# Patient Record
Sex: Female | Born: 1955 | Race: White | Hispanic: No | Smoking: Former smoker
Health system: Southern US, Community
[De-identification: ages and names within clinical notes are randomized; demographics above are authoritative.]

## PROBLEM LIST (undated history)

## (undated) DIAGNOSIS — F329 Major depressive disorder, single episode, unspecified: Secondary | ICD-10-CM

## (undated) DIAGNOSIS — E7800 Pure hypercholesterolemia, unspecified: Secondary | ICD-10-CM

## (undated) DIAGNOSIS — E119 Type 2 diabetes mellitus without complications: Secondary | ICD-10-CM

## (undated) DIAGNOSIS — F32A Depression, unspecified: Secondary | ICD-10-CM

## (undated) DIAGNOSIS — C439 Malignant melanoma of skin, unspecified: Secondary | ICD-10-CM

## (undated) DIAGNOSIS — G4733 Obstructive sleep apnea (adult) (pediatric): Secondary | ICD-10-CM

## (undated) DIAGNOSIS — E669 Obesity, unspecified: Secondary | ICD-10-CM

## (undated) DIAGNOSIS — Z9989 Dependence on other enabling machines and devices: Secondary | ICD-10-CM

## (undated) HISTORY — DX: Obstructive sleep apnea (adult) (pediatric): G47.33

## (undated) HISTORY — DX: Dependence on other enabling machines and devices: Z99.89

## (undated) HISTORY — DX: Malignant melanoma of skin, unspecified: C43.9

## (undated) HISTORY — PX: INCISE AND DRAIN ABCESS: PRO64

## (undated) HISTORY — DX: Type 2 diabetes mellitus without complications: E11.9

## (undated) HISTORY — DX: Obesity, unspecified: E66.9

## (undated) HISTORY — PX: OTHER SURGICAL HISTORY: SHX169

---

## 2001-02-28 ENCOUNTER — Encounter: Payer: Self-pay | Admitting: Family Medicine

## 2001-02-28 ENCOUNTER — Ambulatory Visit (HOSPITAL_COMMUNITY): Admission: RE | Admit: 2001-02-28 | Discharge: 2001-02-28 | Payer: Self-pay | Admitting: Family Medicine

## 2001-04-27 ENCOUNTER — Encounter: Payer: Self-pay | Admitting: *Deleted

## 2001-04-27 ENCOUNTER — Emergency Department (HOSPITAL_COMMUNITY): Admission: EM | Admit: 2001-04-27 | Discharge: 2001-04-27 | Payer: Self-pay | Admitting: *Deleted

## 2003-01-29 ENCOUNTER — Ambulatory Visit (HOSPITAL_COMMUNITY): Admission: RE | Admit: 2003-01-29 | Discharge: 2003-01-29 | Payer: Self-pay | Admitting: Internal Medicine

## 2003-01-29 HISTORY — PX: COLONOSCOPY: SHX174

## 2003-08-07 ENCOUNTER — Emergency Department (HOSPITAL_COMMUNITY): Admission: EM | Admit: 2003-08-07 | Discharge: 2003-08-07 | Payer: Self-pay | Admitting: Emergency Medicine

## 2004-07-09 ENCOUNTER — Emergency Department (HOSPITAL_COMMUNITY): Admission: EM | Admit: 2004-07-09 | Discharge: 2004-07-09 | Payer: Self-pay | Admitting: Emergency Medicine

## 2004-07-10 ENCOUNTER — Inpatient Hospital Stay (HOSPITAL_COMMUNITY): Admission: EM | Admit: 2004-07-10 | Discharge: 2004-07-16 | Payer: Self-pay | Admitting: Emergency Medicine

## 2004-07-17 ENCOUNTER — Encounter (HOSPITAL_COMMUNITY): Admission: RE | Admit: 2004-07-17 | Discharge: 2004-08-16 | Payer: Self-pay | Admitting: General Surgery

## 2004-08-25 ENCOUNTER — Ambulatory Visit (HOSPITAL_COMMUNITY): Admission: RE | Admit: 2004-08-25 | Discharge: 2004-08-25 | Payer: Self-pay | Admitting: General Surgery

## 2005-09-09 ENCOUNTER — Inpatient Hospital Stay (HOSPITAL_COMMUNITY): Admission: EM | Admit: 2005-09-09 | Discharge: 2005-09-13 | Payer: Self-pay | Admitting: Emergency Medicine

## 2006-06-06 ENCOUNTER — Emergency Department (HOSPITAL_COMMUNITY): Admission: EM | Admit: 2006-06-06 | Discharge: 2006-06-06 | Payer: Self-pay | Admitting: Emergency Medicine

## 2006-06-08 ENCOUNTER — Emergency Department (HOSPITAL_COMMUNITY): Admission: EM | Admit: 2006-06-08 | Discharge: 2006-06-08 | Payer: Self-pay | Admitting: Emergency Medicine

## 2006-06-28 ENCOUNTER — Ambulatory Visit (HOSPITAL_COMMUNITY): Admission: RE | Admit: 2006-06-28 | Discharge: 2006-06-28 | Payer: Self-pay | Admitting: Family Medicine

## 2008-09-22 ENCOUNTER — Ambulatory Visit (HOSPITAL_COMMUNITY): Admission: RE | Admit: 2008-09-22 | Discharge: 2008-09-22 | Payer: Self-pay | Admitting: Family Medicine

## 2008-12-02 ENCOUNTER — Ambulatory Visit: Payer: Self-pay | Admitting: Internal Medicine

## 2008-12-08 ENCOUNTER — Encounter: Payer: Self-pay | Admitting: Gastroenterology

## 2008-12-31 ENCOUNTER — Encounter: Payer: Self-pay | Admitting: Internal Medicine

## 2008-12-31 ENCOUNTER — Ambulatory Visit (HOSPITAL_COMMUNITY): Admission: RE | Admit: 2008-12-31 | Discharge: 2008-12-31 | Payer: Self-pay | Admitting: Internal Medicine

## 2008-12-31 ENCOUNTER — Ambulatory Visit: Payer: Self-pay | Admitting: Internal Medicine

## 2009-01-04 ENCOUNTER — Encounter: Payer: Self-pay | Admitting: Internal Medicine

## 2009-01-05 ENCOUNTER — Telehealth: Payer: Self-pay | Admitting: Internal Medicine

## 2009-01-17 ENCOUNTER — Telehealth (INDEPENDENT_AMBULATORY_CARE_PROVIDER_SITE_OTHER): Payer: Self-pay

## 2009-01-18 ENCOUNTER — Telehealth (INDEPENDENT_AMBULATORY_CARE_PROVIDER_SITE_OTHER): Payer: Self-pay

## 2009-07-29 ENCOUNTER — Emergency Department (HOSPITAL_COMMUNITY): Admission: EM | Admit: 2009-07-29 | Discharge: 2009-07-29 | Payer: Self-pay | Admitting: Emergency Medicine

## 2009-08-03 ENCOUNTER — Inpatient Hospital Stay (HOSPITAL_COMMUNITY): Admission: RE | Admit: 2009-08-03 | Discharge: 2009-08-12 | Payer: Self-pay | Admitting: Family Medicine

## 2009-10-20 ENCOUNTER — Ambulatory Visit (HOSPITAL_COMMUNITY): Admission: RE | Admit: 2009-10-20 | Discharge: 2009-10-20 | Payer: Self-pay | Admitting: Family Medicine

## 2010-01-20 ENCOUNTER — Encounter (INDEPENDENT_AMBULATORY_CARE_PROVIDER_SITE_OTHER): Payer: Self-pay | Admitting: *Deleted

## 2010-02-07 ENCOUNTER — Ambulatory Visit: Payer: Self-pay | Admitting: Internal Medicine

## 2010-02-07 DIAGNOSIS — K6289 Other specified diseases of anus and rectum: Secondary | ICD-10-CM | POA: Insufficient documentation

## 2010-02-07 DIAGNOSIS — K625 Hemorrhage of anus and rectum: Secondary | ICD-10-CM | POA: Insufficient documentation

## 2010-02-07 DIAGNOSIS — Z86010 Personal history of colonic polyps: Secondary | ICD-10-CM | POA: Insufficient documentation

## 2010-02-17 ENCOUNTER — Encounter: Payer: Self-pay | Admitting: Internal Medicine

## 2010-03-03 ENCOUNTER — Ambulatory Visit (HOSPITAL_COMMUNITY): Admission: RE | Admit: 2010-03-03 | Discharge: 2010-03-03 | Payer: Self-pay | Admitting: Internal Medicine

## 2010-03-03 ENCOUNTER — Ambulatory Visit: Payer: Self-pay | Admitting: Internal Medicine

## 2010-03-03 HISTORY — PX: COLONOSCOPY: SHX174

## 2010-03-10 ENCOUNTER — Encounter: Payer: Self-pay | Admitting: Internal Medicine

## 2010-03-15 ENCOUNTER — Ambulatory Visit (HOSPITAL_COMMUNITY): Admission: RE | Admit: 2010-03-15 | Discharge: 2010-03-15 | Payer: Self-pay | Admitting: General Surgery

## 2010-05-26 ENCOUNTER — Emergency Department (HOSPITAL_COMMUNITY)
Admission: EM | Admit: 2010-05-26 | Discharge: 2010-05-26 | Payer: Self-pay | Source: Home / Self Care | Admitting: Emergency Medicine

## 2010-06-25 HISTORY — PX: ANAL FISSURE REPAIR: SHX2312

## 2010-07-25 NOTE — Letter (Signed)
Summary: Scheduled Appointment  [facility]  [address]   [place], [place] [address]   Phone: [phone]  Fax: [phone]    January 20, 2010   Dear: [name]            DOB: [birth year]    I have been instructed to schedule you an appointment in our office.  Your appointment is as follows:   Date: 02/07/2010   Time: 10:00 AM  Please be here 15 minutes early.   Provider: Tana Coast, PA    Please contact the office if you need to reschedule this appointment for a more convenient time.   Thank you,    Rexene Alberts       [facility] Ph: [phone]   Fax: [phone]

## 2010-07-25 NOTE — Letter (Signed)
Summary: HISTORY & PHYSICAL  HISTORY & PHYSICAL   Imported By: Rexene Alberts 03/10/2010 12:20:07  _____________________________________________________________________  External Attachment:    Type:   Image     Comment:   External Document

## 2010-07-25 NOTE — Letter (Signed)
Summary: FLEX SIG ORDER  FLEX SIG ORDER   Imported By: Rosine Beat 02/17/2010 14:20:02  _____________________________________________________________________  External Attachment:    Type:   Image     Comment:   External Document

## 2010-07-25 NOTE — Assessment & Plan Note (Signed)
Summary: C/O RECTAL BLEEDING/LAW   Visit Type:  Follow-up Visit Primary Care Provider:  [name]  Chief Complaint:  c/o rectal bleeding.  History of Present Illness: [name] is a pleasant 55 y/o WF, who presents for further evaluation of rectal bleeding and mild rectal pain. She has TCS in 7/10 for h/o polyps. 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 She states she started having rectal pain after taking enema last yr for procedure. Symptoms were severe for awhile but eventually went away.   She complains of recurrent rectal pain, but not as severe as last year. Rectal bleeding for awhile, but recently increased bleeding. Soil through pants. Rectal burning. If strain, rectal pain.  After had PNA in 2/11, lots of straining. Brought back on pain. BM 3-4 per day, normal for her. Formed stools. No melena. No abd pain, n/v, heartburn. No weight loss. PP fecal urgency. Knows where all bathrooms are. No nocturnal BMs.   Current Medications (verified): 1)  Celexa 40 Mg Tabs (Citalopram Hydrobromide) 2)  Zocor 80 Mg Tabs (Simvastatin) .... Take 1 Tablet By Mouth Once A Day 3)  Omeprazole 20 Mg Cpdr (Omeprazole) .... Take 1 Tablet By Mouth Once A Day 4)  B 12 .... Take 1 Tablet By Mouth Once A Day 5)  Vitamin D 41937 Iu .... Once Weekly  Allergies (verified): No Known Drug Allergies  Past History:  Past Medical History: Depression Hyperlipidemia Vit D deficiency Colon polyps, see HPI GERD Hospitalized for PNA 07/2009 TCS 7/10-->Distal rectal polyp (tubulovillous adenoma), otherwise unremarkable rectum. Elongated tortuous colon, scattered left-sided diverticula, polyps (tubular adenomas) in the mid-descending and ascending segments. Next TCS 12/2011.  Past Surgical History: Reviewed history from 12/02/2008 and no changes required. MRSA left axillary   Family History: Reviewed history from 12/02/2008 and no changes required. No FH of Colon  Cancer or polyps  Social History: Reviewed history from 12/02/2008 and no changes required. Patient is a former smoker. Quit 15 years ago. Alcohol Use - no Illicit Drug Use - no Married, no children. Housekeeper at [employer]  Review of Systems General:  Denies fever, chills, sweats, anorexia, fatigue, weakness, and weight loss. Eyes:  Denies vision loss. ENT:  Denies nasal congestion, sore throat, hoarseness, and difficulty swallowing. CV:  Denies chest pains, angina, palpitations, dyspnea on exertion, and peripheral edema. Resp:  Denies dyspnea at rest, dyspnea with exercise, cough, sputum, and wheezing. GI:  See HPI. GU:  Denies urinary burning and blood in urine. MS:  Denies joint pain / LOM. Derm:  Denies rash and itching. Neuro:  Denies weakness, frequent headaches, memory loss, and confusion. Psych:  Denies depression and anxiety. Endo:  Denies unusual weight change. Heme:  Denies bruising and bleeding. Allergy:  Denies hives, rash, and sneezing.  Vital Signs:  Patient profile:   55 year old female Height:      68 inches Weight:      311 pounds BMI:     47.46 Temp:     98.9 degrees F oral Pulse rate:   80 / minute BP sitting:   132 / 80  (left arm) Cuff size:   large  Vitals Entered By: Cloria Spring LPN (February 07, 2010 10:05 AM)  Physical Exam  General:  Well developed, well nourished, no acute distress.obese.   Head:  Normocephalic and atraumatic. Eyes:  sclera nonicteric Mouth:  Oropharyngeal mucosa moist, pink.  No lesions, erythema or exudate.    Neck:  Supple; no masses or thyromegaly.  Lungs:  Clear throughout to auscultation. Heart:  Regular rate and rhythm; no murmurs, rubs,  or bruits. Abdomen:  Bowel sounds normal.  Abdomen is soft, nontender, nondistended.  No rebound or guarding.  No hepatosplenomegaly, masses or hernias.  No abdominal bruits. obese.   Rectal:  Deferred per pt request. Exam planned at time of FS. Extremities:  No clubbing, cyanosis,  edema or deformities noted. Neurologic:  Alert and  oriented x4;  grossly normal neurologically. Skin:  Intact without significant lesions or rashes. Cervical Nodes:  No significant cervical adenopathy. Psych:  Alert and cooperative. Normal mood and affect.  Impression & Recommendations:  Problem # 1:  RECTAL BLEEDING (ICD-569.3)  Increased rectal bleeding associated with mild to moderate anorectal pain. Last year there was consideration that patient developed fissure. Symptoms resolved until over the past several months. She had advanced adenoma in distal rectum. Patient likely has fissure or other benign anorectal disease but given her history of advanced adenoma and increased bleeding, offered her flex sigmoidoscopy with sedation. Patient is reluctant to internal creams or suppositories due to discomfort and fear of hurting herself. Will hold off on therapy until after FS.   Flex Sig with sedation to be performed in near future.  Risks, alternatives, and benefits including but not limited to the risk of reaction to medication, bleeding, infection, and perforation were addressed.  Patient voiced understanding and provided verbal consent.   Orders: Est. Patient Level IV (25956)

## 2010-09-07 LAB — CBC
HCT: 39.5 % (ref 36.0–46.0)
MCHC: 33.4 g/dL (ref 30.0–36.0)
Platelets: 266 10*3/uL (ref 150–400)
RBC: 4.46 MIL/uL (ref 3.87–5.11)
RDW: 15.4 % (ref 11.5–15.5)
WBC: 6.4 10*3/uL (ref 4.0–10.5)

## 2010-09-07 LAB — BASIC METABOLIC PANEL
BUN: 14 mg/dL (ref 6–23)
CO2: 27 mEq/L (ref 19–32)
Chloride: 104 mEq/L (ref 96–112)
Creatinine, Ser: 0.85 mg/dL (ref 0.4–1.2)
Potassium: 4.3 mEq/L (ref 3.5–5.1)

## 2010-09-13 LAB — BASIC METABOLIC PANEL
BUN: 10 mg/dL (ref 6–23)
BUN: 28 mg/dL — ABNORMAL HIGH (ref 6–23)
BUN: 6 mg/dL (ref 6–23)
CO2: 27 mEq/L (ref 19–32)
CO2: 27 mEq/L (ref 19–32)
CO2: 27 mEq/L (ref 19–32)
Calcium: 7.9 mg/dL — ABNORMAL LOW (ref 8.4–10.5)
Calcium: 8.1 mg/dL — ABNORMAL LOW (ref 8.4–10.5)
Chloride: 106 mEq/L (ref 96–112)
Chloride: 110 mEq/L (ref 96–112)
Chloride: 98 mEq/L (ref 96–112)
Creatinine, Ser: 0.57 mg/dL (ref 0.4–1.2)
Creatinine, Ser: 0.8 mg/dL (ref 0.4–1.2)
Creatinine, Ser: 0.92 mg/dL (ref 0.4–1.2)
Creatinine, Ser: 1.38 mg/dL — ABNORMAL HIGH (ref 0.4–1.2)
GFR calc Af Amer: >60 mL/min (ref >60)
GFR calc Af Amer: >60 mL/min (ref >60)
GFR calc non Af Amer: >60 mL/min (ref >60)
Glucose, Bld: 108 mg/dL — ABNORMAL HIGH (ref 70–99)
Glucose, Bld: 126 mg/dL — ABNORMAL HIGH (ref 70–99)
Glucose, Bld: 128 mg/dL — ABNORMAL HIGH (ref 70–99)
Potassium: 3.1 mEq/L — ABNORMAL LOW (ref 3.5–5.1)
Potassium: 3.1 mEq/L — ABNORMAL LOW (ref 3.5–5.1)
Sodium: 146 mEq/L — ABNORMAL HIGH (ref 135–145)

## 2010-09-13 LAB — DIFFERENTIAL
Basophils Absolute: 0 10*3/uL (ref 0.0–0.1)
Basophils Relative: 0 % (ref 0–1)
Basophils Relative: 0 % (ref 0–1)
Basophils Relative: 1 % (ref 0–1)
Eosinophils Absolute: 0 10*3/uL (ref 0.0–0.7)
Eosinophils Absolute: 0 10*3/uL (ref 0.0–0.7)
Eosinophils Absolute: 0 10*3/uL (ref 0.0–0.7)
Eosinophils Absolute: 0.1 10*3/uL (ref 0.0–0.7)
Eosinophils Relative: 0 % (ref 0–5)
Eosinophils Relative: 0 % (ref 0–5)
Eosinophils Relative: 1 % (ref 0–5)
Lymphocytes Relative: 7 % — ABNORMAL LOW (ref 12–46)
Lymphs Abs: 0.5 10*3/uL — ABNORMAL LOW (ref 0.7–4.0)
Lymphs Abs: 0.5 10*3/uL — ABNORMAL LOW (ref 0.7–4.0)
Lymphs Abs: 0.6 10*3/uL — ABNORMAL LOW (ref 0.7–4.0)
Lymphs Abs: 0.6 10*3/uL — ABNORMAL LOW (ref 0.7–4.0)
Lymphs Abs: 0.6 10*3/uL — ABNORMAL LOW (ref 0.7–4.0)
Monocytes Relative: 3 % (ref 3–12)
Monocytes Relative: 4 % (ref 3–12)
Monocytes Relative: 6 % (ref 3–12)
Neutro Abs: 6.6 10*3/uL (ref 1.7–7.7)
Neutro Abs: 6.6 10*3/uL (ref 1.7–7.7)
Neutrophils Relative %: 76 % (ref 43–77)
Neutrophils Relative %: 80 % — ABNORMAL HIGH (ref 43–77)
Neutrophils Relative %: 84 % — ABNORMAL HIGH (ref 43–77)
Neutrophils Relative %: 86 % — ABNORMAL HIGH (ref 43–77)

## 2010-09-13 LAB — CBC
HCT: 35.4 % — ABNORMAL LOW (ref 36.0–46.0)
HCT: 37.6 % (ref 36.0–46.0)
HCT: 38.4 % (ref 36.0–46.0)
HCT: 41.2 % (ref 36.0–46.0)
Hemoglobin: 12.6 g/dL (ref 12.0–15.0)
MCHC: 33.5 g/dL (ref 30.0–36.0)
MCV: 86.1 fL (ref 78.0–100.0)
MCV: 86.6 fL (ref 78.0–100.0)
MCV: 86.7 fL (ref 78.0–100.0)
MCV: 86.9 fL (ref 78.0–100.0)
Platelets: 183 10*3/uL (ref 150–400)
Platelets: 268 10*3/uL (ref 150–400)
Platelets: 446 10*3/uL — ABNORMAL HIGH (ref 150–400)
Platelets: 447 10*3/uL — ABNORMAL HIGH (ref 150–400)
RBC: 4.09 MIL/uL (ref 3.87–5.11)
RBC: 4.43 MIL/uL (ref 3.87–5.11)
RBC: 4.78 MIL/uL (ref 3.87–5.11)
RDW: 15 % (ref 11.5–15.5)
RDW: 15.2 % (ref 11.5–15.5)
RDW: 15.6 % — ABNORMAL HIGH (ref 11.5–15.5)
WBC: 4.6 10*3/uL (ref 4.0–10.5)
WBC: 4.8 10*3/uL (ref 4.0–10.5)
WBC: 5.3 10*3/uL (ref 4.0–10.5)
WBC: 7.9 10*3/uL (ref 4.0–10.5)
WBC: 8.2 10*3/uL (ref 4.0–10.5)

## 2010-09-13 LAB — BLOOD GAS, ARTERIAL
Acid-Base Excess: 1.5 mmol/L (ref 0.0–2.0)
Acid-Base Excess: 2.6 mmol/L — ABNORMAL HIGH (ref 0.0–2.0)
Acid-Base Excess: 2.6 mmol/L — ABNORMAL HIGH (ref 0.0–2.0)
Bicarbonate: 26 mEq/L — ABNORMAL HIGH (ref 20.0–24.0)
Bicarbonate: 26.3 mEq/L — ABNORMAL HIGH (ref 20.0–24.0)
Bicarbonate: 26.6 mEq/L — ABNORMAL HIGH (ref 20.0–24.0)
Delivery systems: POSITIVE
Expiratory PAP: 6
FIO2: 1 %
FIO2: 1 %
FIO2: 1 %
FIO2: 100 %
FIO2: 50 %
FIO2: 60 %
FIO2: 80 %
Inspiratory PAP: 14
O2 Content: 100 L/min
O2 Content: 6 L/min
O2 Saturation: 86 %
O2 Saturation: 92.6 %
O2 Saturation: 94.9 %
O2 Saturation: 95 %
O2 Saturation: 95.5 %
Patient temperature: 37
Patient temperature: 37
Patient temperature: 37
TCO2: 23.5 mmol/L (ref 0–100)
pCO2 arterial: 36.8 mmHg (ref 35.0–45.0)
pCO2 arterial: 37.2 mmHg (ref 35.0–45.0)
pCO2 arterial: 37.8 mmHg (ref 35.0–45.0)
pH, Arterial: 7.418 — ABNORMAL HIGH (ref 7.350–7.400)
pH, Arterial: 7.436 — ABNORMAL HIGH (ref 7.350–7.400)
pH, Arterial: 7.437 — ABNORMAL HIGH (ref 7.350–7.400)
pO2, Arterial: 51.9 mmHg — ABNORMAL LOW (ref 80.0–100.0)
pO2, Arterial: 66.1 mmHg — ABNORMAL LOW (ref 80.0–100.0)
pO2, Arterial: 69 mmHg — ABNORMAL LOW (ref 80.0–100.0)
pO2, Arterial: 72.6 mmHg — ABNORMAL LOW (ref 80.0–100.0)
pO2, Arterial: 76.7 mmHg — ABNORMAL LOW (ref 80.0–100.0)
pO2, Arterial: 77.9 mmHg — ABNORMAL LOW (ref 80.0–100.0)
pO2, Arterial: 78.1 mmHg — ABNORMAL LOW (ref 80.0–100.0)

## 2010-09-13 LAB — COMPREHENSIVE METABOLIC PANEL
ALT: 30 U/L (ref 0–35)
ALT: 32 U/L (ref 0–35)
AST: 48 U/L — ABNORMAL HIGH (ref 0–37)
AST: 70 U/L — ABNORMAL HIGH (ref 0–37)
Alkaline Phosphatase: 55 U/L (ref 39–117)
CO2: 27 mEq/L (ref 19–32)
Calcium: 7.8 mg/dL — ABNORMAL LOW (ref 8.4–10.5)
Calcium: 8.3 mg/dL — ABNORMAL LOW (ref 8.4–10.5)
GFR calc Af Amer: 28 mL/min — ABNORMAL LOW (ref >60)
GFR calc Af Amer: >60 mL/min (ref >60)
GFR calc non Af Amer: 23 mL/min — ABNORMAL LOW (ref >60)
Potassium: 3.4 mEq/L — ABNORMAL LOW (ref 3.5–5.1)
Sodium: 132 mEq/L — ABNORMAL LOW (ref 135–145)
Sodium: 142 mEq/L (ref 135–145)
Total Protein: 5.4 g/dL — ABNORMAL LOW (ref 6.0–8.3)

## 2010-09-13 LAB — CARDIAC PANEL(CRET KIN+CKTOT+MB+TROPI)
Total CK: 1225 U/L — ABNORMAL HIGH (ref 7–177)
Total CK: 1394 U/L — ABNORMAL HIGH (ref 7–177)
Troponin I: 0.04 ng/mL (ref 0.00–0.06)

## 2010-09-13 LAB — CULTURE, BLOOD (ROUTINE X 2): Culture: NO GROWTH

## 2010-09-13 LAB — VANCOMYCIN, TROUGH: Vancomycin Tr: 13.9 ug/mL (ref 10.0–20.0)

## 2010-11-07 NOTE — Op Note (Signed)
[name], [name]              ACCOUNT NO.:  [id number]   MEDICAL RECORD NO.:  [id number]          PATIENT TYPE:  AMB   LOCATION:  DAY                           FACILITY:  [facility]   PHYSICIAN:  R. Roetta Sessions, M.D. DATE OF BIRTH:  06-06-1956   DATE OF PROCEDURE:  12/31/2008  DATE OF DISCHARGE:                               OPERATIVE REPORT   PROCEDURE:  Colonoscopy and snare polypectomy biopsy.   INDICATIONS FOR PROCEDURE:  The patient is a 55 year old lady with a  history of colonic adenomas removed back in 2001 with colonoscopy lastly  being done in 2004.  Hyperplastic polyps only were removed.  She has no  lower GI tract symptoms.  Colonoscopy is now being done as surveillance  maneuver.  The risks, benefits, alternatives, and limitations have been  reviewed, questions answered.  Please see the documentation in the  medical record.   PROCEDURE NOTE:  O2 saturation, blood pressure, pulse, and respirations  were monitored the entire procedure.  Conscious sedation with Versed 5  mg IV and Demerol mg IV in divided doses.   INSTRUMENT:  Pentax video chip system.   FINDINGS:  Digital rectal exam revealed no abnormalities.   Endoscopic findings:  Prep was good.   Colon:  The colonic mucosa was surveyed from the rectosigmoid junction  through the left, transverse, and right colon to appendiceal orifice,  ileocecal valve, and cecum.  These structures were well-seen and  photographed for the record.  From this level, the scope was slowly and  cautiously withdrawn.  All previously-mentioned mucosal surfaces were  again seen.  The patient had a flat 7-mm polyp in the mid-ascending  colon which was removed with hot snare technique and recovered through  the scope.  There were multiple diminutive polyps in the more distal  ascending segment which were cold biopsied/removed.  In the mid  descending colon, there was a second 7-mm polyp which was pedunculated.  It was removed with hot  snare technique and recovered through the scope.  The patient also had scattered left-sided diverticula.  The patient's  colon was somewhat elongated and tortuous but otherwise appeared  unremarkable.  The scope was pulled down into the rectum where a  thorough examination of the rectal mucosa including retroflexed view of  the anal verge demonstrated a 9-mm pedunculated polyp at 3 cm from the  anal verge.  This was removed with hot snare cautery technique and  recovered through the scope.  The patient tolerated the procedure well  and was reacted in endoscopy.  Cecal withdrawal time 18 minutes.   IMPRESSION:  1. Distal rectal polyp status post hot snare removal as described      above, otherwise unremarkable rectum.  2. Elongated tortuous colon, scattered left-sided diverticula, polyps      in the mid-descending and ascending segments removed as described      above.   RECOMMENDATIONS:  1. No aspirin or __________ for 5 days.  2. Follow-up on pathology.  3. Further recommendations to follow.      Jonathon Bellows, M.D.  Electronically Signed  RMR/MEDQ  D:  12/31/2008  T:  12/31/2008  Job:  [id number]   cc:   [name]. [name], M.D.  Fax: [phone]

## 2010-11-10 NOTE — Group Therapy Note (Signed)
[name], King              ACCOUNT NO.:  [id number]   MEDICAL RECORD NO.:  [id number]          PATIENT TYPE:  INP   LOCATION:  A411                          FACILITY:  [facility]   PHYSICIAN:  [name]. [name], M.D.DATE OF BIRTH:  [birth year]   DATE OF PROCEDURE:  DATE OF DISCHARGE:                                   PROGRESS NOTE   SUBJECTIVE:  She said that the pain has really cleared up in the chest wall.  This was done once her abscess was opened.  She really is feeling better.   OBJECTIVE:  Temperature 97.2, pulse 87, respiratory rate 18, blood pressure  136/74.  She is ambulating in the hall.  Discomforts of the area of  cellulitis is much less red than it was even yesterday.  The wound cultures  are still pending.  Wound did show gram positive cocci in pairs on the gram  stain.   ASSESSMENT:  1.  Cellulitis of the chest wall.  2.  Probable MRSA.  3.  Morbid obesity.   PLAN:  Continue vancomycin.  Discharge home tomorrow.  She needs her packing  changed twice a day.  Discussed with Dr. Malvin Johns.  Will discharge her home  with Zyvox 600 mg b.i.d. (#10, no refills).  While we are waiting for the  final culture results.  If she does have MRSA we will treat her for 30 days.      SDK/MEDQ  D:  07/15/2004  T:  07/15/2004  Job:  [id number]

## 2010-11-10 NOTE — Consult Note (Signed)
NAMEMARIGNY, BORRE NO.:  [id number]   MEDICAL RECORD NO.:  [id number]          PATIENT TYPE:  INP   LOCATION:  A225                          FACILITY:  [facility]   PHYSICIAN:  Barbaraann Barthel, M.D. DATE OF BIRTH:  [birth year]   DATE OF CONSULTATION:  DATE OF DISCHARGE:                                   CONSULTATION   SURGICAL CONSULTATION   Surgery was asked to see this 55 year old white female for an infection of  her left side.  In essence, she had a little folliculitis on her left breast  which she states that she did not squeeze, however, this became more  inflamed.  On Saturday, she went to the emergency room.  She was treated  with  Keflex and returned to the emergency room on followup today two days  later after 24-48 hours worth of treatment, and cellulitis was noted.  Fine  needle aspiration was attempted by the emergency room physician.  No pussy  loculation was encountered.  The patient was admitted to the medical  service, and surgery was asked to consult and evaluate.  In essence, this  patient has an area over the lateral aspect of her left breast.  There is no  fluctuation or crepitance appreciated.  There is an area where it appears  that she has had a fine needle aspiration, but there are no loculations  appreciated.  She has been admitted to the medical service.  She has been  place on parenteral antibiotics.   Her temperature is 98.4, the blood pressure 129/77, heart rate 82, and  respirations 18.  She is a nondiabetic.  Apart from her depression and  hypercholesterolemia and her morbid obesity, she has had no previous surgery  and has no ongoing medical problems otherwise.   IMPRESSION:  Mastitis (cellulitis of left breast).  No abscess appreciated  clinically.   PLAN:  Agree with present parenteral antibiotics.  The patient has been  placed on vancomycin by the medical service.  She has received Levaquin as  well as Keflex as an outpatient  without resolution.  I have also added  moist, warm compresses to her left breast continuously, and I will follow  closely.  This does not appear to be anything suggestive of necrotizing  fasciitis.  The patient does not appear toxic or clinically of a more  worrisome deep soft tissue infection.  Will follow with you.  Would like to  thank Dr. [name] for the consultation.  The rest of the physical  examination is basically as per history and physical of Dr. Michelle Nasuti.  See. Dr. Michelle Nasuti notes.      WB/MEDQ  D:  07/10/2004  T:  07/10/2004  Job:  [id number]

## 2010-11-10 NOTE — Group Therapy Note (Signed)
[name], [name]              ACCOUNT NO.:  [id number]   MEDICAL RECORD NO.:  [id number]          PATIENT TYPE:  INP   LOCATION:  A411                          FACILITY:  [facility]   PHYSICIAN:  [name], M.D.DATE OF BIRTH:  1956-04-14   DATE OF PROCEDURE:  DATE OF DISCHARGE:                                   PROGRESS NOTE   SUBJECTIVE:  She feels a lot better today.  The soreness is clear from her  chest, but she notes it is still red.   OBJECTIVE:  Temperature 98.7, pulse 81, respiratory rate 20, blood pressure  115/63.  She is up brushing her teeth when I first see her.  Is moving  around the room well.  She still has an area of intense erythema involving  the left chest wall, extending to the left lateral breast.   Today the white cell count is 9900 with 72 neutrophils, 16 lymphs; H&H is  11.9 and 37.3.  Met-7 shows a glucose of 127.   ASSESSMENT:  1.  Cellulitis of the chest wall probably from methicillin-resistant      Staphylococcus aureus.  2.  Hyperglycemia.  3.  Obesity.   PLAN:  Continue the vancomycin IV, the warm compresses.  Follow up with Dr.  Malvin Johns in case this abscesses.      SDK/MEDQ  D:  07/12/2004  T:  07/12/2004  Job:  [id number]

## 2010-11-10 NOTE — Group Therapy Note (Signed)
[name], [name]              ACCOUNT NO.:  [id number]   MEDICAL RECORD NO.:  [id number]          PATIENT TYPE:  INP   LOCATION:  A411                          FACILITY:  [facility]   PHYSICIAN:  Angus G. Renard Matter, MD   DATE OF BIRTH:  1955-09-10   DATE OF PROCEDURE:  DATE OF DISCHARGE:                                   PROGRESS NOTE   This patient was admitted with cellulitis of the chest wall due to  methicillin-resistant Staphylococcus aureus. She does have hyperglycemia and  morbid obesity.  Remains on vancomycin.  She has had I&D of chest wall.   OBJECTIVE:  Vital signs: blood pressure 125/72, respirations 18, pulse 97,  temperature 97.1.   LUNGS:  Clear.  HEART:  Regular rhythm.  ABDOMEN:  Negative.  The patient has a draining abscess left chest wall.   ASSESSMENT:  The patient was admitted with an abscess and cellulitis left  chest wall, methicillin-resistant Staphylococcus.  This has been drained.  The patient's condition has remained stable.     Angu   AGM/MEDQ  D:  07/16/2004  T:  07/16/2004  Job:  [id number]

## 2010-11-10 NOTE — Group Therapy Note (Signed)
NAMECHARISMA, CHARLOT              ACCOUNT NO.:  [id number]   MEDICAL RECORD NO.:  [id number]          PATIENT TYPE:  INP   LOCATION:  A411                          FACILITY:  [facility]   PHYSICIAN:  [name]. [name], M.D.DATE OF BIRTH:  [birth year]   DATE OF PROCEDURE:  07/13/2004  DATE OF DISCHARGE:                                   PROGRESS NOTE   SUBJECTIVE:  She is still having pain in the left chest wall, about the same  as yesterday. She is getting up and walking a lot in the halls, staying up  in the chair.   OBJECTIVE:  VITAL SIGNS:  Temperature 97.8, pulse 85, respiratory rate 22,  blood pressure 140/87.  GENERAL:  She is supine in bed currently. Mom is visiting in the room. She  is no acute distress, well-developed, obese, oriented, and alert.  LUNGS:  Clear throughout.  HEART:  Regular rhythm, rate of about 80.  CHEST:  She still has an area of erythema extending in the left chest wall  just inferior to the axilla and extending over the left lateral breast. The  erythema is less than yesterday, but centrally, there is a bluish-violaceous  discoloration and some bogginess.   ASSESSMENT:  1.  Cellulitis of the chest wall, probably secondary to methicillin-      resistant staphylococcus aureus.  2.  Question abscess formation.  3.  Hyperglycemia.  4.  Morbid obesity.   PLAN:  Will discuss with Dr. Malvin Johns about possible I&D. Meanwhile, we will  continue with the vancomycin IV.      SDK/MEDQ  D:  07/13/2004  T:  07/13/2004  Job:  [id number]

## 2010-11-10 NOTE — Group Therapy Note (Signed)
[name], [name]              ACCOUNT NO.:  [id number]   MEDICAL RECORD NO.:  [id number]          PATIENT TYPE:  INP   LOCATION:  A323                          FACILITY:  [facility]   PHYSICIAN:  [name]. [name], M.D.DATE OF BIRTH:  06-22-1956   DATE OF PROCEDURE:  09/12/2005  DATE OF DISCHARGE:                                   PROGRESS NOTE   SUBJECTIVE:  She slept much better last night and, in fact, felt groggy this  morning on Ambien 10 mg nightly.  She had dreams.  She feels she is  breathing better.  She reminds me she got sick about 3 weeks ago with  minimal upper respiratory symptoms, at most a little bit of clear  rhinorrhea.  She has never had asthma before or bronchitis before like this.  She does not smoke.   OBJECTIVE:  VITAL SIGNS:  Temperature 97.7, pulse 70, respirations 20, blood  pressure 135/72.  O2 saturations on room air is 94%.  GENERAL:  She is in no acute distress, well-developed and morbidly obese.  Oriented and alert.  She is supine in bed.  LUNGS:  She sits up and the lungs are clear throughout moving air well,  although decreased breath sounds due to obesity.  There are no intercostal  retractions and there are no use of accessory muscles or respirations.  HEART:  Regular rate and rhythm with rate of about 70.  Heart sounds  somewhat distant.  No edema of the ankles.   ASSESSMENT:  1.  Probable bronchitis.  2.  Morbid obesity.   PLAN:  We are checking a stat CBC, D-dimer and also CT of the lung with  pulmonary emboli protocol to make sure she is not having pulmonary emboli.  I am decreasing her prednisone from 10 mg two b.i.d. to 10 mg one b.i.d. and  discontinuing her Ambien switching it to Temazepam 30 mg nightly.  Hopefully, she will be able to be discharged tomorrow if there are no  pulmonary emboli.      [name]. [name], M.D.  Electronically Signed     SDK/MEDQ  D:  09/12/2005  T:  09/13/2005  Job:  [id number]

## 2010-11-10 NOTE — Group Therapy Note (Signed)
[name], King              ACCOUNT NO.:  [id number]   MEDICAL RECORD NO.:  [id number]          PATIENT TYPE:  INP   LOCATION:  A323                          FACILITY:  [facility]   PHYSICIAN:  [name]. [name], M.D.DATE OF BIRTH:  1955-12-14   DATE OF PROCEDURE:  DATE OF DISCHARGE:                                   PROGRESS NOTE   SUBJECTIVE:  She had a lot of trouble sleeping last night despite using  Ambien 5 mg.  She also was jittery.  Still signs she is having some  wheezing.   OBJECTIVE:  VITAL SIGNS:  Temperature 97.7, pulse 81, respirations 16, blood  pressure 120/71.  LUNGS:  Decreased breath sounds throughout.  There were some expiratory  wheezes in the posterior lungs.  HEART:  Regular rhythm rate of about 80.  ABDOMEN:  Soft and obese.  There is no edema in the ankles.  Color is good  on room air, and O2 saturations 94% and 97% room air today.   ASSESSMENT:  1.  Bronchitis, probably allergic.  2.  Possible bacterial bronchitis.  3.  Morbid obesity.  4.  Insomnia, probably related to steroids.  5.  High dose steroid use.   PLAN:  Discontinue Solu-Medrol and switch to prednisone 20 mg b.i.d.  Discontinue Ambien 5 mg, switch to Ambien 10 mg q.h.s. p.r.n. sleep.  Reevaluate tomorrow and discharge if she is doing fine and p.o. steroids.      [name]. [name], M.D.  Electronically Signed     SDK/MEDQ  D:  09/11/2005  T:  09/12/2005  Job:  [id number]

## 2010-11-10 NOTE — H&P (Signed)
[name], [name]              ACCOUNT NO.:  [id number]   MEDICAL RECORD NO.:  [id number]          PATIENT TYPE:  INP   LOCATION:  A323                          FACILITY:  [facility]   PHYSICIAN:  Catalina Pizza, M.D.        DATE OF BIRTH:  1956-04-15   DATE OF ADMISSION:  09/09/2005  DATE OF DISCHARGE:  LH                                HISTORY & PHYSICAL   PRIMARY CARE PHYSICIAN:  [name]. [name], M.D.   HISTORY OF PRESENT ILLNESS:  [name] is a 55 year old white female who  apparently has had three weeks of upper respiratory symptoms including dry  cough and some amount of wheezing. Apparently, he has been taking multiple  medicines over the counter which has not helped whatsoever. 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 She denies any fever or chills. Did note that she  was having continued dry hacking cough. Apparently, she also had a  gastroenteritis-type bug approximately three weeks ago and saw Dr. [name]  for some type of injection which cured it.   MEDICATIONS:  She is only on lovastatin 40 mg p.o. daily and citalopram 40  mg p.o. daily.   PAST MEDICAL HISTORY:  Only significant for depression, hypercholesterolemia  and morbid obesity. Apparently, she also had a MRSA cellulitis in thoracic  wall in January of 2006 requiring surgical consultation.   SOCIAL HISTORY:  Lives at home with her husband. Has previous history of  smoking but does not smoke currently. No alcohol or other medical issues.   REVIEW OF SYSTEMS:  Only significant for shortness of breath and wheezing  causing her to have worsening nonproductive cough. She also states that she  has had significant amount of thirst over the last month to two months but  no polyrrhea. She also states that she has had a significant amount of  sweating profusely over the last  several months.   PHYSICAL EXAMINATION:  VITAL SIGNS:  Temperature is 98.1, blood pressure  138/91, pulse 99, respirations 20; 0/10 pain at this time; saturating 96% on  room air.  GENERAL:  This is a pleasant morbidly obese white female who appears to have  some mild anxiety at this time.  HEENT:  Pupils are equal, round, and reactive to light and accommodation. No  scleral icterus. Mucous membranes are a bit dry but no other lesions  appreciated. No JVD. No thyromegaly.  LUNGS:  Show good air movement throughout. No significant consolidation that  I can appreciate, but she is morbidly obese. She does not use any accessory  muscles at this time. Does have end-expiratory wheezing but much of this is  upper airway transmitted below.  HEART:  Regular rhythm with no murmur.  ABDOMEN:  Soft, nontender, protuberant with positive bowel sounds.  EXTREMITIES:  No lower extremity edema.  NEUROLOGICAL:  Intact. No deficits appreciated. The patient does have some  mild anxiety.   LABORATORY DATA:  Lab work  obtained shows CBC:  White count of 6.0,  hemoglobin 13.2, platelet count of 310.   Images obtained shows a patchy left lower lobe infiltrates but possibly  difficult secondary to morbid obesity.   IMPRESSION:  This is a 55 year old white female with possible left lower  lobe pneumonia and superimposed bronchitis with morbid obesity.   ASSESSMENT AND PLAN:  1.  The patient will be admitted to regular bed and continued on Levaquin IV      and started on Solu-Medrol to help with inflammation and wheezing as      well as continue with albuterol and Atrovent. After treatment in the      emergency department, she felt like her breathing was improved. We will      also continue with Robitussin DM for cough suppressant.  2.  Excessive thirst and heat intolerance. Will check a CMET and TSH as well      as UA looking for any signs of high blood sugars or problems with      thyroid.    DISPOSITION:  The patient will be seen and evaluated by Dr. [name]  tomorrow. I have discussed with her husband that likely will only need to be  in the hospital for a day, maybe two, for IV antibiotics, but then will be  able to continue as an outpatient on oral medicines, and this will be  followed up on and evaluated by Dr. [name].      Catalina Pizza, M.D.  Electronically Signed     ZH/MEDQ  D:  09/09/2005  T:  09/10/2005  Job:  [id number]

## 2010-11-10 NOTE — Group Therapy Note (Signed)
[name], [name]              ACCOUNT NO.:  [id number]   MEDICAL RECORD NO.:  [id number]          PATIENT TYPE:  INP   LOCATION:  A411                          FACILITY:  [facility]   PHYSICIAN:  [name]. [name], M.D.DATE OF BIRTH:  October 07, 1955   DATE OF PROCEDURE:  07/14/2004  DATE OF DISCHARGE:                                   PROGRESS NOTE   SUBJECTIVE:  She feels a good bit better.  Dr. Malvin Johns did I&D of her left  chest wall yesterday.   OBJECTIVE:  GENERAL:  She is sitting up in bed, feet on the floor, eating  breakfast.  Color is good.  She is alert and oriented in no acute distress.  VITAL SIGNS:  Temperature 97.6, pulse 83, respirations 20, blood pressure  120/69.  LUNGS:  Clear.  HEART:  A regular rhythm with rate of 70.  CHEST:  The wound shows less erythema over her area of cellulitis than there  was yesterday.   Today, white cell count is 10,400.  Glucose 128.  She had both anaerobic and  aerobic cultures done yesterday at the time of surgery and both these show a  few pairs of gram positive cocci.   ASSESSMENT:  1.  Cellulitis of the chest wall, probably methicillin-resistant      Staphylococcus aureus.  2.  Abscess of same, status post incision and drainage.  3.  Hyperglycemia.  4.  Morbid obesity.   PLAN:  Continue with the vancomycin and the drains.  If she is doing better  tomorrow possibly send her home on Zyvox.      SDK/MEDQ  D:  07/14/2004  T:  07/14/2004  Job:  [id number]

## 2010-11-10 NOTE — Group Therapy Note (Signed)
NAMEAXELLE, SZWED              ACCOUNT NO.:  [id number]   MEDICAL RECORD NO.:  [id number]          PATIENT TYPE:  INP   LOCATION:  A323                          FACILITY:  [facility]   PHYSICIAN:  [name]. [name], M.D.DATE OF BIRTH:  [birth year]   DATE OF PROCEDURE:  09/10/2005  DATE OF DISCHARGE:                                   PROGRESS NOTE   SUBJECTIVE:  The patient was admitted yesterday with what appeared to be  allergic bronchitis.  She says her wheezing is better if she is sitting or  lying, but when she starts to stir around she starts wheezing again and gets  very short of breath.   OBJECTIVE:  VITAL SIGNS:  Temperature 97.6, pulse 72, respirations 16, blood  pressure 138/75.  GENERAL:  The color is good.  LUNGS:  Clear throughout.  She is moving air well.  There are no intercostal  retractions, use of accessory muscles or respiration and no  obvious  wheezing or rhonchi heard at rest.  HEART:  Regular rhythm with no murmurs and rate of 70.  There is no edema of  the ankles.   Today, white cell count is 6000, 88% neutrophils, 11 lymphs, H&H 13.3 and  39.3, platelet count 320,000.  Her glucose was 179.  Urine with specific  gravity 1.030, pH 5.5, wbc's 0-2, rbc's 0-2 and many bacteria.   ASSESSMENT:  1.  Probable allergic bronchitis.  2.  Hyperglycemia.  3.  Morbid obesity.  4.  Depression.  5.  Hypercholesterolemia.   PLAN:  1.  Add Singulair 10 mg daily with fexofenadine 180 mg daily and Levaquin      500 mg daily (given the shift to the left).  2.  Continue Atrovent nebulizer treatments with Solu-Medrol 125 mg IV q.8h.,      simvastatin 20 mg daily and Celexa 40 mg daily.  3.  Re-evaluate tomorrow and will discharge home on p.o. steroids when the      wheezing has cleared.      [name]. [name], M.D.  Electronically Signed     SDK/MEDQ  D:  09/10/2005  T:  09/11/2005  Job:  [id number]

## 2010-11-10 NOTE — H&P (Signed)
NAMEBECKEY, POLKOWSKI              ACCOUNT NO.:  [id number]   MEDICAL RECORD NO.:  [id number]          PATIENT TYPE:  EMS   LOCATION:  ED                            FACILITY:  [facility]   PHYSICIAN:  [name]. [name], M.D.DATE OF BIRTH:  [birth year]   DATE OF ADMISSION:  07/10/2004  DATE OF DISCHARGE:  LH                                HISTORY & PHYSICAL   This 55 year old woman developed pain and redness and swelling of her left  chest wall just lateral to the left breast, starting 2 days ago.  She was  seen in the [facility] ED yesterday and put on Vicodin and  Keflex, and follow up today showed that this had gotten worse.  She had more  swelling, more pain.   She has generally been fairly healthy.  She has been on Celexa 40 mg daily  for depression; Zocor 40 mg daily for hypercholesterolemia.  She has battled  with obesity.   She has had no other problems than the pain with this.  She has had fever  and chills but no cough and no GI symptoms except for constipation the last  couple of days.   PHYSICAL EXAMINATION:  GENERAL:  A pleasant, middle-aged woman, who was a  good historian.  She was in distress due to pain from this infection.  VITAL SIGNS:  Temperature 99.3 P.O., the blood pressure 104/64, the pulse  104, the respiratory rate 20.  LUNGS:  Clear throughout.  HEART:  Regular rhythm without murmur, rate of 100.  Under her left chest  wall, just several inches inferior to her left axilla and extending to the  left lateral breast, there was an area of erythema, swelling, and redness.  ABDOMEN:  Her abdomen was soft without hepatosplenomegaly or mass or  tenderness except up in the left upper quadrant near the area of cellulitis.  EXTREMITIES:  There is no edema of the ankles.   ADMISSION DIAGNOSES:  1.  Cellulitis of the thoracic wall, probably due to methicillin-resistant      Staphylococcus aureus.  2.  Obesity.  3.  Hypercholesterolemia.  4.   Depression.   She is to be admitted to the hospital.  She has already received IV Levaquin  and doxycycline, but we are adding vancomycin.  I am having her surgeon, Dr.  Malvin Johns, see her in consult.  We are putting her on Tylox q.4h. for pain  with morphine IV if needed.  We will continue Celexa 40 mg daily; Zocor 40  mg daily, with Ambien 10 mg q.h.s. p.r.n. sleep.  Discussed all this with  patient and husband.  I&D of the center of this was already attempted by the  emergency room physician, but apparently there was not an abscess at this  time.      SDK/MEDQ  D:  07/10/2004  T:  07/10/2004  Job:  [id number]

## 2010-11-10 NOTE — Discharge Summary (Signed)
NAMECHANDELL, King              ACCOUNT NO.:  [id number]   MEDICAL RECORD NO.:  [id number]          PATIENT TYPE:  INP   LOCATION:  A323                          FACILITY:  [facility]   PHYSICIAN:  [name]. [name], M.D.DATE OF BIRTH:  Jan 05, 1956   DATE OF ADMISSION:  09/09/2005  DATE OF DISCHARGE:  03/22/2007LH                                 DISCHARGE SUMMARY   HISTORY OF PRESENT ILLNESS:  This 55 year old woman was admitted to the  hospital with pneumonia.  She had a fairly benign 5-day hospital course  extending from September 09, 2005, to September 13, 2005.  Vital signs remained  stable.   Admission CBC showed a white cell count of 6000 of which 46% were  neutrophils, 41 lymphs and 11 monos.  Her H&H is 13.2 and 40.0, MCV 88,  platelet count 310,000.  She had a TSH of 2.323.  A UA showed specific  gravity greater than 1.030 and otherwise was negative.  A repeat CBC of 6000  with 88 neutrophils and 11 lymphs her second day with CBC 15,600 with 88  neutrophils and 6 lymphs hospital day #4.  At that time, she also was noted  to have atypical lymphocytes and toxic granulations in the white cells and  greater than 20% bands with large platelets as well.  Her CMP on the second  day showed a glucose of 179, albumin 3.2.  D-dimer on her fourth day was  0.40 (normal 0-0.48).   Reportable chest on admission showed patchy left lower lobe infiltrates.  Her CT angiogram of the chest showed no CT evidence of pulmonary embolus,  but ill-defined, peribronchial vascular nodularity in the left upper lower  lobe was felt to represent atypical viral pneumonia.   HOSPITAL COURSE:  She was admitted to the hospital and started on IV at [facility]  and started on Lovastatin 40 mg daily and kept on Citalopram 40 mg daily and  then also started on Levaquin 750 mg IV daily with Solu-Medrol 125 mg IV  q.8h, albuterol and Atrovent nebulizer treatments, morphine 1 mg IV q.4h.  for severe pain, Robitussin DM for cough.   On her second day, Singulair 10  mg daily was added along with Claritin 10 mg daily.  Levaquin was switched  to 5 mg p.o. daily.  On the third day, Solu-Medrol was discontinued and she  was having a lot of problems sleeping on this and she switched to prednisone  20 mg b.i.d. and given Ambien 10 mg nightly for sleep.  On her fourth day,  since she was still having symptoms, the CT of the chest was obtained which  showed what appeared to be an atypical pneumonia.  At that time, we stopped  the Ambien and switched to Temazepam 30 mg nightly. for sleep and also  decreased her prednisone to 10 mg b.i.d.   On her fifth day, she was really was much improved despite her elevated  white cell count the day before.  Clinically, she was stable and ready for  discharge home.   DISCHARGE MEDICATIONS:  1.  Levaquin 500 mg daily (7  with no refills).  2.  Prednisone 10 mg b.i.d. (30 with no refills).  3.  Temazepam 30 mg nightly for sleep.  4.  Combivent two puffs q.4h. p.r.n. (p.r.n. refills).  5.  Singulair 10 mg daily (30 with 11 refills).  6.  Celexa 40 mg daily.  7.  Simvastatin 20 mg daily.   FOLLOW UP:  Follow up in the office in 5-6 days.   DISCHARGE DIAGNOSES:  1.  Viral pneumonia.  2.  Morbid obesity.  3.  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  4.  Hypercholesterolemia.  5.  Depression.   Time spent with this patient today on reviewing her electronic and paper  medical record, examining the patient, talking to the patient and her  husband, printing out a list of her medicines, talking to nursing and  dictating a note was 40 minutes.      [name]. [name], M.D.  Electronically Signed     SDK/MEDQ  D:  09/13/2005  T:  09/14/2005  Job:  [id number]

## 2010-11-10 NOTE — Discharge Summary (Signed)
[name], [name]              ACCOUNT NO.:  [id number]   MEDICAL RECORD NO.:  [id number]          PATIENT TYPE:  INP   LOCATION:  A411                          FACILITY:  [facility]   PHYSICIAN:  Barbaraann Barthel, M.D. DATE OF BIRTH:  May 13, 1956   DATE OF ADMISSION:  07/10/2004  DATE OF DISCHARGE:  01/22/2006LH                                 DISCHARGE SUMMARY   Dr. Malvin Johns dictating this discharge summary as a favor to Dr. [name].   CONSULTATIONS:  Dr. Malvin Johns of the surgery department.   HISTORY:  This is a 55 year old obese, white female patient who was admitted  to the medical service an inflamed left breast.  She had approximately a 6-  day history of problems with pain in her breast.  She finally sought medical  attention.  She was seen in the emergency room where cellulitis was noted.  She also had a needle aspiration performed there without any drainage  produced.  She was admitted to the medical service for treatment of her  cellulitis.  Surgery was consulted on 07/10/04 to rule out abscess.  There  was no abscess clinically at that time.  An abscess did develop after moist  heat and antibiotics were continued.  She was taken to the operating room  when it became obvious that this cellulitis had progressed and an abscess  was present.  She was taken to surgery on 07/13/04 at which time an abscess  was drained.  Cultures were obtained for sensitivities.  The wound was  debrided and irrigated copiously and packed open with normal saline soaked  gauze roll.  She was then seen in consultation with the physical therapy  department who did b.i.d. dressing changes as well.  The angry, erythematous  rash that she had around the breast was resolving.  This was presumed to be  by the medical service an MRSA type of infection, and she was placed on IV  vancomycin on her admission.  The erythema was slowly resolving at the time  of discharge, and on 07/16/04 at the time of discharge her  wound appeared a  lot less angry.  She had remained afebrile; her vital signs were stable; and  she had no signs of any toxicity.  She was discharged on 07/16/04 with  followup arrangements being made with the physical therapy department and  also for my wound care perioperatively.   It should be noted that final cultures are pending.  Also we did a biopsy of  the skin and dermal layers to rule out the unlikely possibility of  inflammatory carcinoma of the left breast.  These biopsies are also pending.   LABORATORY DATA:  The patient was admitted with a 17,000 white count with  H&H of 13.3 and 39.1.  On 07/12/04 her white count was down to 9.9 with an  H&H of 12.8 and 37.3.  Her metabolic 7 was grossly within normal limits.  Her blood sugar was 127 randomly and 94 upon admission.  Blood cultures were  negative.   DISCHARGE INSTRUCTIONS:  She is excused from work until further notice.  Her  diet is without restrictions.  She is told to increase her activities as  tolerated.  She is permitted to shower and tub bath.  She is permitted to  walk up and down the stairs, and she is told to refrain from doing any heavy  lifting, driving or sexual activity for the time being.  She is discharged  on Zyvox 600 mg p.o. b.i.d.  Followup arrangements were made and explained  regarding therapy with the PT department.  She is to resume her other  medications as per Dr. [name], and I will follow up with her in the  physical therapy department as well as in my office.  She will as an  outpatient obtain a mammogram when her breast is less tender and she is able  to do so.   Please note that this patient is not given any pain medications as she had a  prescription for Vicodin in the emergency room, and she has medications  still available to her.      WB/MEDQ  D:  07/16/2004  T:  07/16/2004  Job:  [id number]   cc:   Barbaraann Barthel, M.D.  [address]  [place]  [place] [address]  Fax: [phone]    [name]. [name], M.D.  [address] [place], [place] [address]  Fax: [phone]   Physical Therapy Dept

## 2010-11-10 NOTE — Op Note (Signed)
NAME:  [name], [name]                        ACCOUNT NO.:  [id number]   MEDICAL RECORD NO.:  [id number]                   PATIENT TYPE:  AMB   LOCATION:  DAY                                  FACILITY:  [facility]   PHYSICIAN:  Lionel December, M.D.                 DATE OF BIRTH:  05/09/1956   DATE OF PROCEDURE:  01/29/2003  DATE OF DISCHARGE:                                  PROCEDURE NOTE   PROCEDURE:  Total colonoscopy.   INDICATIONS:  [name] is a 55 year old Caucasian female whose last  colonoscopy was in July 2001, when she had multiple adenomas removed, some  of which were large.  She is now returning for surveillance colonoscopy.  She presently does not have any GI symptoms.  Family history is significant  for colon carcinoma in her maternal aunt, who died in her 13s.  The  procedure was reviewed with the patient and informed consent was obtained.   PREMEDICATION:  Demerol 25 mg IV, Versed 5 mg IV.   FINDINGS:  Procedure performed in endoscopy suite.  The patient's vital  signs and O2 saturation were monitored during the procedure and remained  stable.  The patient was placed in the left lateral recumbent position and  rectal examination performed.  No abnormality noted on external or digital  exam.  The Olympus video scope was placed in the rectum and advanced under  vision in the sigmoid colon and beyond.  Preparation was excellent except  she had some stool coating her cecum and ascending colon.  Cecal landmarks  were well-seen and photographed for the record.  As the scope was withdrawn,  colonic mucosa was carefully examined.  There were two small polyps in the  proximal transverse colon and hepatic flexure, which were ablated by cold  biopsy.  Three more were seen in the sigmoid colon and treated in similar  fashion.  There were a few tiny diverticula at sigmoid colon.  The rectal  mucosa was normal other than prominent veins.  Anorectal junction, however,  was normal.   Endoscope was withdrawn.  The patient tolerated the procedure  well.   FINAL DIAGNOSIS:  1. Five small polyps that were ablated via cold biopsy, two from area of     hepatic flexure and three from sigmoid colon.  2. Tiny diverticula at sigmoid colon.    RECOMMENDATIONS:  1. A high-fiber diet.  2. I will contact the patient with biopsy results.  Given today's     colonoscopic findings, I feel she could wait fie years before her next     colonoscopy.                                               Lionel December, M.D.    NR/MEDQ  D:  01/29/2003  T:  01/29/2003  Job:  [id number]   cc:   [name]. [name], M.D.  [address] [place], [place] [address]  Fax: [phone]

## 2010-11-10 NOTE — Group Therapy Note (Signed)
NAMESERENIDY, [name]              ACCOUNT NO.:  [id number]   MEDICAL RECORD NO.:  [id number]          PATIENT TYPE:  INP   LOCATION:  A330                          FACILITY:  [facility]   PHYSICIAN:  [name]. [name], M.D.DATE OF BIRTH:  Aug 11, 1955   DATE OF PROCEDURE:  DATE OF DISCHARGE:                                   PROGRESS NOTE   She is now in the hospital after starting vancomycin yesterday.  Her left  breast and chest wall is not hurting unless she moves.  Temperature 99.2,  pulse 104, respiratory rate 20, blood pressure 131/82.  Her temperature was  100.6 and 99.1 last night.  She is supine in bed, oriented and alert, in no  acute distress.  Lungs are clear.  Heart is a regular rhythm with a rate of  about 90.  There is an area of extreme erythema involving the left chest  wall extending to the left lateral breast, about the same size as it was  yesterday.  The color is greater than yesterday, however.  There is no edema  of the ankles.  Abdomen is soft.   Her admission white cell count yesterday was 17,200 with 82 neutrophils, 10  lymphs, H&H 13.3 and 39.1.  Med-7 showed a sodium of 132.  Blood cultures  are pending.   ASSESSMENT:  1.  Cellulitis of the left chest wall, probably from MRSA.  2. Obesity.   PLAN:  Continue with the vancomycin, warm soaks.  She will need to be up in  a chair.  Will start an ASA 81 mg daily.      SDK/MEDQ  D:  07/11/2004  T:  07/11/2004  Job:  [id number]

## 2010-11-10 NOTE — Consult Note (Signed)
[name], [name]              ACCOUNT NO.:  [id number]   MEDICAL RECORD NO.:  [id number]          PATIENT TYPE:  INP   LOCATION:  A225                          FACILITY:  [facility]   PHYSICIAN:  Barbaraann Barthel, M.D. DATE OF BIRTH:  Oct 16, 1955   DATE OF CONSULTATION:  07/10/2004  DATE OF DISCHARGE:                                   CONSULTATION   ADDENDUM:  Please note that the patient's breasts were examined carefully  and no masses were appreciated and this appeared to be lateral to the left  breast.  This does not appear to be any inflammatory carcinoma. We will  perform a mammogram on this patient who has no family history of carcinoma  but for completeness, especially as it has been three or four years since  she has had a mammogram.  Also we might biopsy the skin to make sure there  are no signs of inflammatory carcinoma although this clinically is a  cellulitis.  I will discuss this further with the medical service.      WB/MEDQ  D:  07/10/2004  T:  07/10/2004  Job:  [id number]   cc:   [name]. [name], M.D.  [address] [place], [place] [address]  Fax: [phone]

## 2010-11-10 NOTE — Op Note (Signed)
[name], [name]              ACCOUNT NO.:  [id number]   MEDICAL RECORD NO.:  [id number]          PATIENT TYPE:  INP   LOCATION:  A411                          FACILITY:  [facility]   PHYSICIAN:  Barbaraann Barthel, M.D. DATE OF BIRTH:  October 08, 1955   DATE OF PROCEDURE:  07/13/2004  DATE OF DISCHARGE:                                 OPERATIVE REPORT   PREOPERATIVE DIAGNOSES:  1.  Abscess, left breast.  2.  Rule out inflammatory carcinoma, left breast.   PROCEDURE:  I&D of left breast with biopsy of left breast.   SPECIMENS:  1.  Breast tissue to rule out inflammatory carcinoma.  2.  Cultures for sensitivities.   NOTE:  This is a 55 year old obese white female who presented to the medical  service with an inflamed left breast which she had had for quite some time.  She stated that by history this was likely a folliculitis which later became  inflamed. She was seen in the emergency room. Needle aspiration was done at  that time. She was admitted to the medical service, placed on vancomycin  with the thought that this possibly might be methicillin-resistant  staphylococcus aureus. The cellulitis resolved, and with heat and continued  antibiotics,  and clinical examination on July 13, 2004, clinically it  was apparent that there was an abscess as well as cellulitis. We made plans  to take her to the operating room, discussing complications not limited to  but including bleeding, infection, and the possibility further surgery might  be required.   GROSS OPERATIVE FINDINGS:  Pus, whitish and foul smelling, pinkish/whitish,  thick creamy type of pus was encountered. This was cultured, though my  impression is that of staph aureus infection. The wound was irrigated after  draining this pus. A biopsy was performed in order to be able to check the  dermal areas to rule out inflammatory carcinoma as well, and after  controlling the bleeding with cautery device and irrigating copiously with  normal saline solution, the wound was packed open with saline soaked rolled  gauze. A sterile dressing was applied. Prior to closure, all sponge, needle,  and instrument counts were found to be correct. Estimated blood loss was  approximately 100 cc. The patient tolerated the procedure well and was taken  to recovery room in satisfactory condition.   *Note:  There will be no change of service on this patient. Dr. [name]  will continue have her on his service, and I will follow wound care and have  made appropriate recommendations for physical therapy department for wound  care, and I will follow up as well.      WB/MEDQ  D:  07/13/2004  T:  07/13/2004  Job:  [id number]

## 2011-06-21 ENCOUNTER — Emergency Department (HOSPITAL_COMMUNITY)
Admission: EM | Admit: 2011-06-21 | Discharge: 2011-06-21 | Disposition: A | Discharge status: Home / Self Care | Payer: 59 | Attending: Emergency Medicine | Admitting: Emergency Medicine

## 2011-06-21 DIAGNOSIS — M549 Dorsalgia, unspecified: Secondary | ICD-10-CM | POA: Insufficient documentation

## 2011-06-21 DIAGNOSIS — M533 Sacrococcygeal disorders, not elsewhere classified: Secondary | ICD-10-CM | POA: Insufficient documentation

## 2011-06-21 HISTORY — DX: Depression, unspecified: F32.A

## 2011-06-21 HISTORY — DX: Major depressive disorder, single episode, unspecified: F32.9

## 2011-06-21 HISTORY — DX: Pure hypercholesterolemia, unspecified: E78.00

## 2011-06-21 MED ORDER — CYCLOBENZAPRINE HCL 10 MG PO TABS: ORAL_TABLET | ORAL | Status: DC

## 2011-06-21 MED ORDER — HYDROCODONE-ACETAMINOPHEN 5-325 MG PO TABS
ORAL_TABLET | ORAL | Status: DC
Start: 2011-06-21 — End: 2013-03-23

## 2011-06-21 MED ORDER — CYCLOBENZAPRINE HCL 10 MG PO TABS
10.0000 mg | ORAL_TABLET | Freq: Once | ORAL | Status: AC
  Administered 2011-06-21: 10 mg via ORAL
  Filled 2011-06-21: qty 1

## 2011-06-21 MED ORDER — HYDROCODONE-ACETAMINOPHEN 5-325 MG PO TABS
1.0000 | ORAL_TABLET | Freq: Once | ORAL | Status: AC
  Administered 2011-06-21: 1 via ORAL
  Filled 2011-06-21: qty 1

## 2011-06-21 NOTE — ED Provider Notes (Signed)
Medical screening examination/treatment/procedure(s) were performed by non-physician practitioner and as supervising physician I was immediately available for consultation/collaboration.   Tarry Fountain L Fedrick Cefalu, MD 06/21/11 [id number] 

## 2011-06-21 NOTE — ED Provider Notes (Signed)
History     CSN: [id number]  Arrival date & time 06/21/11  1155   First MD Initiated Contact with Patient 06/21/11 1455      Chief Complaint  Patient presents with  . Back Pain    (Consider location/radiation/quality/duration/timing/severity/associated sxs/prior treatment) Patient is a 55 y.o. female presenting with back pain. The history is provided by the patient and the spouse.  Back Pain  This is a new problem. Episode onset: 5 days ago. The problem occurs constantly. The problem has been gradually worsening. The pain is associated with no known injury. The pain is present in the sacro-iliac joint. The pain does not radiate. The pain is moderate. The symptoms are aggravated by bending. The pain is the same all the time. Pertinent negatives include no fever, no numbness and no weakness. She has tried NSAIDs for the symptoms. The treatment provided no relief. Risk factors include obesity, lack of exercise and a sedentary lifestyle.    Past Medical History  Diagnosis Date  . Hypercholesterolemia   . Depression     Past Surgical History  Procedure Date  . Incise and drain abcess   . Anal fissure repair     No family history on file.  History  Substance Use Topics  . Smoking status: Never Smoker   . Smokeless tobacco: Not on file  . Alcohol Use: No    OB History    Grav Para Term Preterm Abortions TAB SAB Ect Mult Living                  Review of Systems  Constitutional: Negative for fever.  Musculoskeletal: Positive for back pain.  Neurological: Negative for weakness and numbness.  All other systems reviewed and are negative.    Allergies  Review of patient's allergies indicates no known allergies.  Home Medications   Current Outpatient Rx  Name Route Sig Dispense Refill  . CITALOPRAM HYDROBROMIDE 40 MG PO TABS Oral Take 40 mg by mouth daily.      Marland Kitchen DOCUSATE SODIUM 100 MG PO CAPS Oral Take 300 mg by mouth at bedtime.      . IBUPROFEN 200 MG PO TABS  Oral Take 800 mg by mouth every 6 (six) hours as needed. For pain     . OMEPRAZOLE 20 MG PO CPDR Oral Take 20 mg by mouth daily.      Marland Kitchen SIMVASTATIN 80 MG PO TABS Oral Take 80 mg by mouth daily.      Marland Kitchen UNKNOWN TO PATIENT Topical Apply 1 application topically 2 (two) times daily. Cream for foot. Pt unsure of name     . VITAMIN B-12 1000 MCG PO TABS Oral Take 1,000 mcg by mouth daily.      Marland Kitchen VITAMIN D (ERGOCALCIFEROL) 50000 UNITS PO CAPS Oral Take 50,000 Units by mouth every 7 (seven) days. On Wednesdays usually       BP 130/66  Pulse 75  Temp(Src) 98.2 F (36.8 C) (Oral)  Resp 18  Ht 5\' 7"  (1.702 m)  Wt 312 lb (141.522 kg)  BMI 48.87 kg/m2  SpO2 96%  Physical Exam  Nursing note and vitals reviewed. Constitutional: She is oriented to person, place, and time. She appears well-developed and well-nourished. No distress.  HENT:  Head: Normocephalic and atraumatic.  Right Ear: External ear normal.  Left Ear: External ear normal.  Eyes: EOM are normal.  Neck: Normal range of motion.  Cardiovascular: Normal rate, regular rhythm and normal heart sounds.   Pulmonary/Chest: Effort  normal and breath sounds normal.  Abdominal: Soft. She exhibits no distension. There is no tenderness.  Musculoskeletal: Normal range of motion.       Back:  Neurological: She is alert and oriented to person, place, and time. She has normal strength. No cranial nerve deficit or sensory deficit. She displays a negative Romberg sign. Coordination and gait normal. GCS eye subscore is 4. GCS verbal subscore is 5. GCS motor subscore is 6.  Reflex Scores:      Patellar reflexes are 2+ on the right side and 2+ on the left side.      Achilles reflexes are 2+ on the right side and 2+ on the left side. Skin: Skin is warm and dry. She is not diaphoretic.  Psychiatric: She has a normal mood and affect. Judgment normal.    ED Course  Procedures (including critical care time)  Labs Reviewed - No data to display No results  found.   No diagnosis found.    MDM          Worthy Rancher, PA 06/21/11 [phone]

## 2011-06-21 NOTE — ED Notes (Signed)
Pt reports mid back pain x 4 days.  Says tried to work today but when picked up a  Chair her back started hurting worse.  Pt also reports for the past month she has noticed when she falls asleep, she leaks urine.  Says urine smell is very strong.

## 2011-12-03 ENCOUNTER — Encounter: Payer: Self-pay | Admitting: Internal Medicine

## 2012-09-27 ENCOUNTER — Encounter: Payer: Self-pay | Admitting: Dietician

## 2012-09-27 ENCOUNTER — Encounter: Payer: 59 | Attending: Family Medicine | Admitting: Dietician

## 2012-09-27 VITALS — Ht 67.0 in | Wt 315.0 lb

## 2012-09-27 DIAGNOSIS — E119 Type 2 diabetes mellitus without complications: Secondary | ICD-10-CM | POA: Insufficient documentation

## 2012-09-27 DIAGNOSIS — Z713 Dietary counseling and surveillance: Secondary | ICD-10-CM | POA: Insufficient documentation

## 2012-09-27 NOTE — Progress Notes (Signed)
Patient was seen on 09/27/2012 for the complete series of three diabetes self-management courses at the [facility]. Current A1c = 9.7% on 09/04/2012  The following learning objectives were met by the patient during this course: Core 1:  Gain an understanding of diabetes and what causes it  Learn how diabetes is treated and the goals of treatment  Learn why, how and when to test BG  Learn how carbohydrate affects your glucose  Receive a personal food plan and learn how to count carbohydrates  Discover how physical activity enhances glucose control and overall health  Begin to gain confidence in your ability to manage diabetes  Handouts given during this class include:  Type 2 Diabetes: Basics Book  My Food Plan Book  Food and Activity Log  Core 2:  Describe causes, symptoms and treatment of hypoglycemia and hyperglycemia  Learn how to care for your glucose meter and strips  Explain how to manage diabetes during illness  List strategies to follow meal plan when dining out  Describe the effects of alcohol on glucose and how to use it safely  Describe problem solving skills for day-to-day glucose challenges  Describe ways to remain physically active  Describe the impact of regular activity on insulin resistance  Handouts given in this class:  Refrigerator magnet for Sick Day Guidelines  [facility] Oral Medication and Insulin handout  Core 3  Describe how diabetes changes over time  Understand why glucose Reanna Scoggin be out of target  Learn how diabetes changes over time  Learn about blood pressure, cholesterol, and heart health  Learn about lowering dietary fat and sodium  Understand the benefits of physical activity for heart health  Develop problem solving skills for times when glucose numbers are puzzling  Develop strategies for creating life balance  Learn how to identify if your treatment plan needs to change  Develop strategies  for dealing with stress, depression and staying motivated  Identify healthy weight-loss plans  Gain confidence that you can succeed in caring for your diabetes  Establish 2-3 goals that they will plan to diligently work on until they return                   for the free 76-month follow-up visit   The following handouts were given in class:  3 Month Follow Up Visit handout  Goal setting handout  Class evaluation form  Your patient has established the following 3 month goals for diabetes self-care:  Count carbohydrates at most meals and snacks  Increase my activity for 20 minutes at least 4 times a week  Take medications as scheduled  Follow-Up Plan: Patient was offered a 3 month follow-up visit for diabetes self-management education.

## 2012-09-27 NOTE — Patient Instructions (Signed)
Goals:  Follow Diabetes Meal Plan as instructed  Eat 3 meals and 2 snacks, every 3-5 hrs  Limit carbohydrate intake to 30-45 grams carbohydrate/meal  Limit carbohydrate intake to 30 grams carbohydrate/snack  Add lean protein foods to meals/snacks  Monitor glucose levels as instructed by your doctor  Aim for 30 mins of physical activity daily  Bring food record and glucose log to your next nutrition visit   

## 2012-11-10 ENCOUNTER — Other Ambulatory Visit (HOSPITAL_COMMUNITY): Payer: Self-pay | Admitting: Family Medicine

## 2012-11-10 DIAGNOSIS — Z09 Encounter for follow-up examination after completed treatment for conditions other than malignant neoplasm: Secondary | ICD-10-CM

## 2012-11-13 ENCOUNTER — Ambulatory Visit (HOSPITAL_COMMUNITY)
Admission: RE | Admit: 2012-11-13 | Discharge: 2012-11-13 | Disposition: A | Discharge status: Home / Self Care | Payer: 59 | Source: Ambulatory Visit | Attending: Family Medicine | Admitting: Family Medicine

## 2012-11-13 DIAGNOSIS — Z09 Encounter for follow-up examination after completed treatment for conditions other than malignant neoplasm: Secondary | ICD-10-CM

## 2012-11-13 DIAGNOSIS — Z1231 Encounter for screening mammogram for malignant neoplasm of breast: Secondary | ICD-10-CM | POA: Insufficient documentation

## 2013-01-26 ENCOUNTER — Encounter: Payer: 59 | Attending: Family Medicine | Admitting: *Deleted

## 2013-01-26 VITALS — Ht 68.0 in | Wt 306.9 lb

## 2013-01-26 DIAGNOSIS — Z713 Dietary counseling and surveillance: Secondary | ICD-10-CM | POA: Insufficient documentation

## 2013-01-26 DIAGNOSIS — E119 Type 2 diabetes mellitus without complications: Secondary | ICD-10-CM | POA: Insufficient documentation

## 2013-01-26 NOTE — Progress Notes (Signed)
  Patient was seen on 01/26/2013 for their 3 month follow-up as a part of the diabetes self-management courses at the [facility]. The following learning objectives were met by your patient during this course:  Patient self reports the following:  Diabetes control has improved since diabetes self-management training: YES, since she started checking her BG and can see results Number of days blood glucose is >200: less often at bedtime, occasionally in AM Last MD appointment for diabetes: July, 2014 Changes in treatment plan: new on Metformin and BG's are better Confidence with ability to manage diabetes: YES Areas for improvement with diabetes self-care: continue with better portion sizes and SMBG Willingness to participate in diabetes support group: NOT AT THIS TIME, TOO FAR TO DRIVE FROM [place]  Your patient has established the following 3 month goals for diabetes self-care:  Count carbohydrates at most meals and snacks YES Increase my activity for 20 minutes at least 4 times a week HAS STARTED WALKING FOR 20 MINUTES BEFORE STARTING WORK EACH AM Take medications as scheduled YES  Please see Diabetes Flow sheet for findings related to patient's self-care. Weight is down 9 pounds in past 4 months  Follow-Up Plan: Patient is eligible for a "free" 30 minute diabetes self-care appointment in the next year. Patient to call and schedule as needed, but plans to follow up with Cranford Mon, RN, from [facility] who comes to [facility] regularly.

## 2013-02-02 ENCOUNTER — Other Ambulatory Visit (HOSPITAL_COMMUNITY): Payer: Self-pay

## 2013-02-02 DIAGNOSIS — G4733 Obstructive sleep apnea (adult) (pediatric): Secondary | ICD-10-CM

## 2013-02-10 ENCOUNTER — Encounter: Payer: Self-pay | Admitting: *Deleted

## 2013-02-24 ENCOUNTER — Ambulatory Visit: Payer: 59 | Attending: Family Medicine | Admitting: Sleep Medicine

## 2013-02-24 VITALS — Ht 67.0 in | Wt 300.0 lb

## 2013-02-24 DIAGNOSIS — G4733 Obstructive sleep apnea (adult) (pediatric): Secondary | ICD-10-CM | POA: Insufficient documentation

## 2013-02-24 DIAGNOSIS — Z6841 Body Mass Index (BMI) 40.0 and over, adult: Secondary | ICD-10-CM | POA: Insufficient documentation

## 2013-02-28 NOTE — Procedures (Signed)
[facility] Amy Summer A. Gerilyn Pilgrim, MD     [email or web address]        [name], [name]              ACCOUNT NO.:  [id number]  MEDICAL RECORD NO.:  [id number]          PATIENT TYPE:  OUT  LOCATION:  SLEEP LAB                     FACILITY:  [facility]  PHYSICIAN:  Pritika Alvarez A. Gerilyn King, M.D. DATE OF BIRTH:  10-17-1955  DATE OF STUDY:  02/24/2013                           NOCTURNAL POLYSOMNOGRAM  REFERRING PHYSICIAN:  [name]. [name], M.D.  INDICATION:  A 57 year old who presents with hypersomnia, snoring, and obesity.   MEDICATIONS:  Metformin, vitamin B12, atorvastatin, omeprazole, citalopram, and stool softener.  EPWORTH SLEEPINESS SCALE:  17.  BMI:  47.  ARCHITECTURAL SUMMARY:  This is a split night recording with initial portion be the diagnostic and the second portion titration recording. The total recording time is 441 minutes.  Sleep efficiency is 71%. Sleep latency is 22 minutes.  REM latency is 51 minutes.  RESPIRATORY SUMMARY:  Baseline oxygen saturation is 93.  Lowest saturation is 76 during non-REM sleep.  Diagnostic AHI is 70.  The patient was placed on positive pressure starting at 6 and increased to 18.  Optimal pressure seemed to be 17 with significant reduction in events and good tolerance.  LIMB MOVEMENT SUMMARY:  PLM index 0.  ELECTROCARDIOGRAM SUMMARY:  Average heart rate is 68 with no significant dysrhythmias observed.  IMPRESSION:  Severe obstructive sleep apnea syndrome which responds well to a CPAP of 17.    Karim Aiello A. Gerilyn King, M.D.    KAD/MEDQ  D:  02/28/2013 17:14:45  T:  02/28/2013 17:44:05  Job:  [id number]

## 2013-03-23 ENCOUNTER — Encounter (HOSPITAL_COMMUNITY): Payer: Self-pay

## 2013-03-23 ENCOUNTER — Emergency Department (HOSPITAL_COMMUNITY)
Admission: EM | Admit: 2013-03-23 | Discharge: 2013-03-23 | Disposition: A | Discharge status: Home / Self Care | Payer: 59 | Attending: Emergency Medicine | Admitting: Emergency Medicine

## 2013-03-23 DIAGNOSIS — E7800 Pure hypercholesterolemia, unspecified: Secondary | ICD-10-CM | POA: Insufficient documentation

## 2013-03-23 DIAGNOSIS — T50905A Adverse effect of unspecified drugs, medicaments and biological substances, initial encounter: Secondary | ICD-10-CM

## 2013-03-23 DIAGNOSIS — E119 Type 2 diabetes mellitus without complications: Secondary | ICD-10-CM | POA: Insufficient documentation

## 2013-03-23 DIAGNOSIS — L509 Urticaria, unspecified: Secondary | ICD-10-CM | POA: Insufficient documentation

## 2013-03-23 DIAGNOSIS — F3289 Other specified depressive episodes: Secondary | ICD-10-CM | POA: Insufficient documentation

## 2013-03-23 DIAGNOSIS — E669 Obesity, unspecified: Secondary | ICD-10-CM | POA: Insufficient documentation

## 2013-03-23 DIAGNOSIS — T368X5A Adverse effect of other systemic antibiotics, initial encounter: Secondary | ICD-10-CM | POA: Insufficient documentation

## 2013-03-23 DIAGNOSIS — Z79899 Other long term (current) drug therapy: Secondary | ICD-10-CM | POA: Insufficient documentation

## 2013-03-23 DIAGNOSIS — F329 Major depressive disorder, single episode, unspecified: Secondary | ICD-10-CM | POA: Insufficient documentation

## 2013-03-23 MED ORDER — FAMOTIDINE 20 MG PO TABS: 20.0000 mg | ORAL_TABLET | Freq: Two times a day (BID) | ORAL | Status: DC

## 2013-03-23 MED ORDER — PREDNISONE (PAK) 10 MG PO TABS: 10.0000 mg | ORAL_TABLET | Freq: Every day | ORAL | Status: DC

## 2013-03-23 MED ORDER — FAMOTIDINE 20 MG PO TABS
20.0000 mg | ORAL_TABLET | Freq: Once | ORAL | Status: AC
  Administered 2013-03-23: 20 mg via ORAL
  Filled 2013-03-23: qty 1

## 2013-03-23 MED ORDER — DIPHENHYDRAMINE HCL 25 MG PO TABS: 25.0000 mg | ORAL_TABLET | Freq: Four times a day (QID) | ORAL | Status: DC

## 2013-03-23 MED ORDER — PREDNISONE 50 MG PO TABS
60.0000 mg | ORAL_TABLET | Freq: Once | ORAL | Status: AC
  Administered 2013-03-23: 60 mg via ORAL
  Filled 2013-03-23 (×2): qty 1

## 2013-03-23 MED ORDER — DIPHENHYDRAMINE HCL 25 MG PO CAPS
50.0000 mg | ORAL_CAPSULE | Freq: Once | ORAL | Status: AC
  Administered 2013-03-23: 50 mg via ORAL
  Filled 2013-03-23: qty 2

## 2013-03-23 NOTE — ED Provider Notes (Signed)
Medical screening examination/treatment/procedure(s) were performed by non-physician practitioner and as supervising physician I was immediately available for consultation/collaboration.   Shelda Jakes, MD 03/23/13 [phone]

## 2013-03-23 NOTE — ED Provider Notes (Signed)
CSN: [id number]     Arrival date & time 03/23/13  7829 History   First MD Initiated Contact with Patient 03/23/13 0919     Chief Complaint  Patient presents with  . Rash   (Consider location/radiation/quality/duration/timing/severity/associated sxs/prior Treatment) Patient is a 57 y.o. female presenting with rash. The history is provided by the patient.  Rash Location:  Full body Quality: itchiness and redness   Severity:  Moderate Onset quality:  Gradual Duration:  4 hours Timing:  Constant Progression:  Worsening Chronicity:  New Context: medications   Relieved by:  Nothing Ineffective treatments:  Antihistamines Associated symptoms: no fever, no headaches, no nausea, no shortness of breath, no sore throat and not vomiting    [name] is a 57 y.o. female who presents to the ED with a rash. The rash started this morning. The only thing different she has done was to take Bactrim DS after she had an abscess drained by her PCP. She finished the Bactrim yesterday and today started with a rash on her abdomen and back and it is now spreading to her arms and legs. The rash is worse in the areas that are hot. She denies shortness of breath, difficulty swallowing or any other problems. The rash itches.   Past Medical History  Diagnosis Date  . Hypercholesterolemia   . Depression   . Diabetes mellitus without complication   . Obesity    Past Surgical History  Procedure Laterality Date  . Incise and drain abcess    . Anal fissure repair     No family history on file. History  Substance Use Topics  . Smoking status: Never Smoker   . Smokeless tobacco: Not on file  . Alcohol Use: No   OB History   Grav Para Term Preterm Abortions TAB SAB Ect Mult Living                 Review of Systems  Constitutional: Negative for fever and chills.  HENT: Negative for sore throat and trouble swallowing.   Respiratory: Negative for shortness of breath.   Gastrointestinal: Negative  for nausea and vomiting.  Genitourinary: Negative for dysuria and urgency.  Musculoskeletal: Negative for joint swelling.  Skin: Positive for rash.  Neurological: Negative for dizziness and headaches.  Psychiatric/Behavioral: The patient is not nervous/anxious.     Allergies  Review of patient's allergies indicates no known allergies.  Home Medications   Current Outpatient Rx  Name  Route  Sig  Dispense  Refill  . citalopram (CELEXA) 40 MG tablet   Oral   Take 40 mg by mouth daily.           . cyclobenzaprine (FLEXERIL) 10 MG tablet      1/2 to 1 tab po TID   20 tablet   0   . docusate sodium (COLACE) 100 MG capsule   Oral   Take 300 mg by mouth at bedtime.           Marland Kitchen HYDROcodone-acetaminophen (NORCO) 5-325 MG per tablet      One tab po q 6 hrs prn pain   20 tablet   0   . ibuprofen (ADVIL,MOTRIN) 200 MG tablet   Oral   Take 800 mg by mouth every 6 (six) hours as needed. For pain          . metFORMIN (GLUCOPHAGE) 500 MG tablet   Oral   Take 500 mg by mouth 2 (two) times daily with a meal.         .  omeprazole (PRILOSEC) 20 MG capsule   Oral   Take 20 mg by mouth daily.           . simvastatin (ZOCOR) 80 MG tablet   Oral   Take 80 mg by mouth daily.           Marland Kitchen UNKNOWN TO PATIENT   Topical   Apply 1 application topically 2 (two) times daily. Cream for foot. Pt unsure of name          . vitamin B-12 (CYANOCOBALAMIN) 1000 MCG tablet   Oral   Take 1,000 mcg by mouth daily.           . Vitamin D, Ergocalciferol, (DRISDOL) 50000 UNITS CAPS   Oral   Take 50,000 Units by mouth every 7 (seven) days. On Wednesdays usually           BP 128/79  Pulse 80  Temp(Src) 97.8 F (36.6 C) (Oral)  Resp 18  SpO2 94% Physical Exam  Nursing note and vitals reviewed. Constitutional: She is oriented to person, place, and time. She appears well-developed and well-nourished. No distress.  HENT:  Head: Normocephalic.  Eyes: Conjunctivae and EOM are  normal. Pupils are equal, round, and reactive to light.  Neck: Normal range of motion. Neck supple.  Cardiovascular: Normal rate and regular rhythm.   Pulmonary/Chest: Effort normal and breath sounds normal. She has no wheezes.  Abdominal: Soft. There is no tenderness.  Musculoskeletal: Normal range of motion.  Neurological: She is alert and oriented to person, place, and time. No cranial nerve deficit.  Skin: Rash noted.  There is a hive like rash over the abdomen, back, upper arms and upper legs.  Psychiatric: She has a normal mood and affect. Her behavior is normal.    ED Course: Prednisone, Pepcid and Benadryl given in the ED.  Procedures   MDM  57 y.o. female with rash due to Bactrim DS. instructions to the patient that she is allergic to Sulfa drugs. Will treat for allergic reaction. Patient stable for discharge home without any immediate complications.  No respiratory symptoms. Discussed with the patient and all questioned fully answered. She will return if any problems arise.    Medication List    STOP taking these medications       sulfamethoxazole-trimethoprim 800-160 MG per tablet  Commonly known as:  BACTRIM DS      TAKE these medications       diphenhydrAMINE 25 MG tablet  Commonly known as:  BENADRYL  Take 1 tablet (25 mg total) by mouth every 6 (six) hours.     famotidine 20 MG tablet  Commonly known as:  PEPCID  Take 1 tablet (20 mg total) by mouth 2 (two) times daily.     predniSONE 10 MG tablet  Commonly known as:  STERAPRED UNI-PAK  Take 1 tablet (10 mg total) by mouth daily. Starting 03/24/2013 take 5 tablets PO, then 4, 3, 2, 1      ASK your doctor about these medications       atorvastatin 80 MG tablet  Commonly known as:  LIPITOR  Take 80 mg by mouth daily.     citalopram 40 MG tablet  Commonly known as:  CELEXA  Take 40 mg by mouth daily.     metFORMIN 500 MG tablet  Commonly known as:  GLUCOPHAGE  Take 500 mg by mouth 2 (two) times daily  with a meal.     omeprazole 20 MG capsule  Commonly known as:  PRILOSEC  Take 20 mg by mouth daily.     vitamin B-12 1000 MCG tablet  Commonly known as:  CYANOCOBALAMIN  Take 1,000 mcg by mouth daily.             [place], [place] 03/23/13 [phone]

## 2013-03-23 NOTE — ED Notes (Signed)
Pt reports rash that began this a.m to her stomach, chest, and back.  Pt reports severe itching.  Pt denies any difficulty breathing or swallowing.

## 2013-08-05 ENCOUNTER — Emergency Department (HOSPITAL_COMMUNITY)
Admission: EM | Admit: 2013-08-05 | Discharge: 2013-08-05 | Disposition: A | Discharge status: Home / Self Care | Payer: PRIVATE HEALTH INSURANCE | Attending: Emergency Medicine | Admitting: Emergency Medicine

## 2013-08-05 ENCOUNTER — Encounter (HOSPITAL_COMMUNITY): Payer: Self-pay | Admitting: Emergency Medicine

## 2013-08-05 DIAGNOSIS — E119 Type 2 diabetes mellitus without complications: Secondary | ICD-10-CM | POA: Insufficient documentation

## 2013-08-05 DIAGNOSIS — IMO0002 Reserved for concepts with insufficient information to code with codable children: Secondary | ICD-10-CM | POA: Insufficient documentation

## 2013-08-05 DIAGNOSIS — S8002XA Contusion of left knee, initial encounter: Secondary | ICD-10-CM

## 2013-08-05 DIAGNOSIS — W010XXA Fall on same level from slipping, tripping and stumbling without subsequent striking against object, initial encounter: Secondary | ICD-10-CM | POA: Insufficient documentation

## 2013-08-05 DIAGNOSIS — F3289 Other specified depressive episodes: Secondary | ICD-10-CM | POA: Insufficient documentation

## 2013-08-05 DIAGNOSIS — E7800 Pure hypercholesterolemia, unspecified: Secondary | ICD-10-CM | POA: Insufficient documentation

## 2013-08-05 DIAGNOSIS — S93401A Sprain of unspecified ligament of right ankle, initial encounter: Secondary | ICD-10-CM

## 2013-08-05 DIAGNOSIS — S93409A Sprain of unspecified ligament of unspecified ankle, initial encounter: Secondary | ICD-10-CM | POA: Insufficient documentation

## 2013-08-05 DIAGNOSIS — W1809XA Striking against other object with subsequent fall, initial encounter: Secondary | ICD-10-CM | POA: Insufficient documentation

## 2013-08-05 DIAGNOSIS — Y9389 Activity, other specified: Secondary | ICD-10-CM | POA: Insufficient documentation

## 2013-08-05 DIAGNOSIS — E663 Overweight: Secondary | ICD-10-CM | POA: Insufficient documentation

## 2013-08-05 DIAGNOSIS — Z79899 Other long term (current) drug therapy: Secondary | ICD-10-CM | POA: Insufficient documentation

## 2013-08-05 DIAGNOSIS — F329 Major depressive disorder, single episode, unspecified: Secondary | ICD-10-CM | POA: Insufficient documentation

## 2013-08-05 DIAGNOSIS — Y929 Unspecified place or not applicable: Secondary | ICD-10-CM | POA: Insufficient documentation

## 2013-08-05 DIAGNOSIS — S8000XA Contusion of unspecified knee, initial encounter: Secondary | ICD-10-CM | POA: Insufficient documentation

## 2013-08-05 NOTE — ED Notes (Signed)
Discharge instructions reviewed with pt, questions answered. Pt verbalized understanding.  

## 2013-08-05 NOTE — Discharge Instructions (Signed)
Use ice on the sores areas 3-4 times a dy for 3-4 days. Use Ibuprofen 400 mg 3 times a day for pain.   Ankle Sprain An ankle sprain is an injury to the strong, fibrous tissues (ligaments) that hold the bones of your ankle joint together.  CAUSES An ankle sprain is usually caused by a fall or by twisting your ankle. Ankle sprains most commonly occur when you step on the outer edge of your foot, and your ankle turns inward. People who participate in sports are more prone to these types of injuries.  SYMPTOMS   Pain in your ankle. The pain may be present at rest or only when you are trying to stand or walk.  Swelling.  Bruising. Bruising may develop immediately or within 1 to 2 days after your injury.  Difficulty standing or walking, particularly when turning corners or changing directions. DIAGNOSIS  Your caregiver will ask you details about your injury and perform a physical exam of your ankle to determine if you have an ankle sprain. During the physical exam, your caregiver will press on and apply pressure to specific areas of your foot and ankle. Your caregiver will try to move your ankle in certain ways. An X-ray exam may be done to be sure a bone was not broken or a ligament did not separate from one of the bones in your ankle (avulsion fracture).  TREATMENT  Certain types of braces can help stabilize your ankle. Your caregiver can make a recommendation for this. Your caregiver may recommend the use of medicine for pain. If your sprain is severe, your caregiver may refer you to a surgeon who helps to restore function to parts of your skeletal system (orthopedist) or a physical therapist. [place] ice to your injury for 1 2 days or as directed by your caregiver. Applying ice helps to reduce inflammation and pain.  Put ice in a plastic bag.  Place a towel between your skin and the bag.  Leave the ice on for 15-20 minutes at a time, every 2 hours while you are  awake.  Only take over-the-counter or prescription medicines for pain, discomfort, or fever as directed by your caregiver.  Elevate your injured ankle above the level of your heart as much as possible for 2 3 days.  If your caregiver recommends crutches, use them as instructed. Gradually put weight on the affected ankle. Continue to use crutches or a cane until you can walk without feeling pain in your ankle.  If you have a plaster splint, wear the splint as directed by your caregiver. Do not rest it on anything harder than a pillow for the first 24 hours. Do not put weight on it. Do not get it wet. You may take it off to take a shower or bath.  You may have been given an elastic bandage to wear around your ankle to provide support. If the elastic bandage is too tight (you have numbness or tingling in your foot or your foot becomes cold and blue), adjust the bandage to make it comfortable.  If you have an air splint, you may blow more air into it or let air out to make it more comfortable. You may take your splint off at night and before taking a shower or bath. Wiggle your toes in the splint several times per day to decrease swelling. SEEK MEDICAL CARE IF:   You have rapidly increasing bruising or swelling.  Your toes feel extremely cold  or you lose feeling in your foot.  Your pain is not relieved with medicine. SEEK IMMEDIATE MEDICAL CARE IF:  Your toes are numb or blue.  You have severe pain that is increasing. MAKE SURE YOU:   Understand these instructions.  Will watch your condition.  Will get help right away if you are not doing well or get worse. Document Released: 06/11/2005 Document Revised: 03/05/2012 Document Reviewed: 06/23/2011 [facility] Patient Information 2014 [place], [place].  Contusion A contusion is a deep bruise. Contusions are the result of an injury that caused bleeding under the skin. The contusion may turn blue, purple, or yellow. Minor injuries will give you  a painless contusion, but more severe contusions may stay painful and swollen for a few weeks.  CAUSES  A contusion is usually caused by a blow, trauma, or direct force to an area of the body. SYMPTOMS   Swelling and redness of the injured area.  Bruising of the injured area.  Tenderness and soreness of the injured area.  Pain. DIAGNOSIS  The diagnosis can be made by taking a history and physical exam. An X-ray, CT scan, or MRI may be needed to determine if there were any associated injuries, such as fractures. TREATMENT  Specific treatment will depend on what area of the body was injured. In general, the best treatment for a contusion is resting, icing, elevating, and applying cold compresses to the injured area. Over-the-counter medicines may also be recommended for pain control. Ask your caregiver what the best treatment is for your contusion. HOME CARE INSTRUCTIONS   Put ice on the injured area.  Put ice in a plastic bag.  Place a towel between your skin and the bag.  Leave the ice on for 15-20 minutes, 03-04 times a day.  Only take over-the-counter or prescription medicines for pain, discomfort, or fever as directed by your caregiver. Your caregiver may recommend avoiding anti-inflammatory medicines (aspirin, ibuprofen, and naproxen) for 48 hours because these medicines may increase bruising.  Rest the injured area.  If possible, elevate the injured area to reduce swelling. SEEK IMMEDIATE MEDICAL CARE IF:   You have increased bruising or swelling.  You have pain that is getting worse.  Your swelling or pain is not relieved with medicines. MAKE SURE YOU:   Understand these instructions.  Will watch your condition.  Will get help right away if you are not doing well or get worse. Document Released: 03/21/2005 Document Revised: 09/03/2011 Document Reviewed: 04/16/2011 [facility] Patient Information 2014 [place], [place].

## 2013-08-05 NOTE — ED Notes (Signed)
Patient reports slipped on an object and fell on pavement this morning. Complaining of pain to left knee and left hip.

## 2013-08-06 NOTE — ED Provider Notes (Signed)
CSN: [id number]     Arrival date & time 08/05/13  5643 History   First MD Initiated Contact with Patient 08/05/13 [phone]     Chief Complaint  Patient presents with  . Knee Pain  . Fall     (Consider location/radiation/quality/duration/timing/severity/associated sxs/prior Treatment) Patient is a 58 y.o. female presenting with knee pain and fall. The history is provided by the patient.  Knee Pain Fall  [name] is a 58 y.o. female who stepped on a small object with her right foot which caused her to fall onto the left knee and sprain her right knee. She walked in to work after that, and was encouraged to come here to get it checked out. No head, neck or back injury.  Past Medical History  Diagnosis Date  . Hypercholesterolemia   . Depression   . Diabetes mellitus without complication   . Obesity    Past Surgical History  Procedure Laterality Date  . Incise and drain abcess    . Anal fissure repair     History reviewed. No pertinent family history. History  Substance Use Topics  . Smoking status: Never Smoker   . Smokeless tobacco: Not on file  . Alcohol Use: No   OB History   Grav Para Term Preterm Abortions TAB SAB Ect Mult Living                 Review of Systems  All other systems reviewed and are negative.      Allergies  Sulfa antibiotics and Sulfur  Home Medications   Current Outpatient Rx  Name  Route  Sig  Dispense  Refill  . atorvastatin (LIPITOR) 80 MG tablet   Oral   Take 80 mg by mouth daily.         . citalopram (CELEXA) 40 MG tablet   Oral   Take 40 mg by mouth daily.           . metFORMIN (GLUCOPHAGE) 500 MG tablet   Oral   Take 500 mg by mouth 2 (two) times daily with a meal.         . omeprazole (PRILOSEC) 20 MG capsule   Oral   Take 20 mg by mouth daily.           . vitamin B-12 (CYANOCOBALAMIN) 1000 MCG tablet   Oral   Take 1,000 mcg by mouth daily.           . diphenhydrAMINE (BENADRYL) 25 MG tablet   Oral   Take 1 tablet (25 mg total) by mouth every 6 (six) hours.   20 tablet   0   . famotidine (PEPCID) 20 MG tablet   Oral   Take 1 tablet (20 mg total) by mouth 2 (two) times daily.   30 tablet   0   . predniSONE (STERAPRED UNI-PAK) 10 MG tablet   Oral   Take 1 tablet (10 mg total) by mouth daily. Starting 03/24/2013 take 5 tablets PO, then 4, 3, 2, 1   15 tablet   0    BP 159/78  Pulse 70  Temp(Src) 97.4 F (36.3 C) (Oral)  Resp 18  Ht 5\' 7"  (1.702 m)  Wt 300 lb (136.079 kg)  BMI 46.98 kg/m2  SpO2 95% Physical Exam  Nursing note and vitals reviewed. Constitutional: She is oriented to person, place, and time. She appears well-developed.  overweight  HENT:  Head: Normocephalic and atraumatic.  Eyes: Conjunctivae and EOM are normal. Pupils are  equal, round, and reactive to light.  Neck: Normal range of motion and phonation normal. Neck supple.  Cardiovascular: Normal rate.   Pulmonary/Chest: Effort normal.  Abdominal: Soft.  Musculoskeletal: Normal range of motion.  Small contusion left anterior knee, below joint line. Tender right ankle without deformity. Normal AROM both knees and ankles, bilateral.  Neurological: She is alert and oriented to person, place, and time. She exhibits normal muscle tone.  Skin: Skin is warm and dry.  Psychiatric: She has a normal mood and affect. Her behavior is normal. Judgment and thought content normal.    ED Course  Procedures (including critical care time)  Findings discussed with pt. She agrees with no need for imaging, and symptomatic care.   MDM   Final diagnoses:  Contusion of knee, left  Sprain of ankle, right    Fall without serious injury.  Nursing Notes Reviewed/ Care Coordinated Applicable Imaging Reviewed Interpretation of Laboratory Data incorporated into ED treatment  The patient appears reasonably screened and/or stabilized for discharge and I doubt any other medical condition or other [facility] requiring further  screening, evaluation, or treatment in the ED at this time prior to discharge.  Plan: Home Medications- Advil; Home Treatments- rest; return here if the recommended treatment, does not improve the symptoms; Recommended follow up- PCP prn    Richarda Blade, MD 08/06/13 1645

## 2013-08-27 ENCOUNTER — Emergency Department (HOSPITAL_COMMUNITY)
Admission: EM | Admit: 2013-08-27 | Discharge: 2013-08-27 | Disposition: A | Discharge status: Home / Self Care | Payer: 59 | Attending: Emergency Medicine | Admitting: Emergency Medicine

## 2013-08-27 ENCOUNTER — Emergency Department (HOSPITAL_COMMUNITY): Payer: 59

## 2013-08-27 ENCOUNTER — Encounter (HOSPITAL_COMMUNITY): Payer: Self-pay | Admitting: Emergency Medicine

## 2013-08-27 DIAGNOSIS — F329 Major depressive disorder, single episode, unspecified: Secondary | ICD-10-CM | POA: Insufficient documentation

## 2013-08-27 DIAGNOSIS — E7800 Pure hypercholesterolemia, unspecified: Secondary | ICD-10-CM | POA: Insufficient documentation

## 2013-08-27 DIAGNOSIS — M545 Low back pain, unspecified: Secondary | ICD-10-CM | POA: Insufficient documentation

## 2013-08-27 DIAGNOSIS — Z791 Long term (current) use of non-steroidal anti-inflammatories (NSAID): Secondary | ICD-10-CM | POA: Insufficient documentation

## 2013-08-27 DIAGNOSIS — F3289 Other specified depressive episodes: Secondary | ICD-10-CM | POA: Insufficient documentation

## 2013-08-27 DIAGNOSIS — E119 Type 2 diabetes mellitus without complications: Secondary | ICD-10-CM | POA: Insufficient documentation

## 2013-08-27 DIAGNOSIS — E669 Obesity, unspecified: Secondary | ICD-10-CM | POA: Insufficient documentation

## 2013-08-27 DIAGNOSIS — M549 Dorsalgia, unspecified: Secondary | ICD-10-CM

## 2013-08-27 DIAGNOSIS — Z79899 Other long term (current) drug therapy: Secondary | ICD-10-CM | POA: Insufficient documentation

## 2013-08-27 MED ORDER — KETOROLAC TROMETHAMINE 60 MG/2ML IM SOLN
60.0000 mg | Freq: Once | INTRAMUSCULAR | Status: AC
  Administered 2013-08-27: 60 mg via INTRAMUSCULAR
  Filled 2013-08-27: qty 2

## 2013-08-27 MED ORDER — CYCLOBENZAPRINE HCL 10 MG PO TABS: 10.0000 mg | ORAL_TABLET | Freq: Two times a day (BID) | ORAL | Status: DC | PRN

## 2013-08-27 MED ORDER — PREDNISONE 20 MG PO TABS: ORAL_TABLET | ORAL | Status: DC

## 2013-08-27 MED ORDER — NAPROXEN 500 MG PO TABS: 500.0000 mg | ORAL_TABLET | Freq: Two times a day (BID) | ORAL | Status: DC

## 2013-08-27 NOTE — Discharge Instructions (Signed)
Plan x-ray was normal. Medication for pain, muscle spasm, prednisone.  Followup with your primary care Dr. If symptoms do not improve, you may need additional x-rays.   This can be determined by Dr. Karie Kirks.

## 2013-08-27 NOTE — ED Provider Notes (Signed)
CSN: [id number]     Arrival date & time 08/27/13  0744 History   First MD Initiated Contact with Patient 08/27/13 [phone]     Chief Complaint  Patient presents with  . Back Pain     (Consider location/radiation/quality/duration/timing/severity/associated sxs/prior Treatment) HPI... low back pain with associated tingling in bilateral lower extremities since Saturday.   Patient apparently fell in parking lot at work a couple weeks ago and she thinks this pain may be related. No bowel or bladder incontinence. Severity is moderate. Has taken over-the-counter products with minimal relief.  Past Medical History  Diagnosis Date  . Hypercholesterolemia   . Depression   . Diabetes mellitus without complication   . Obesity    Past Surgical History  Procedure Laterality Date  . Incise and drain abcess    . Anal fissure repair     No family history on file. History  Substance Use Topics  . Smoking status: Never Smoker   . Smokeless tobacco: Not on file  . Alcohol Use: No   OB History   Grav Para Term Preterm Abortions TAB SAB Ect Mult Living                 Review of Systems  All other systems reviewed and are negative.      Allergies  Sulfa antibiotics and Sulfur  Home Medications   Current Outpatient Rx  Name  Route  Sig  Dispense  Refill  . atorvastatin (LIPITOR) 80 MG tablet   Oral   Take 80 mg by mouth daily.         . citalopram (CELEXA) 40 MG tablet   Oral   Take 40 mg by mouth daily.           . metFORMIN (GLUCOPHAGE) 500 MG tablet   Oral   Take 500 mg by mouth 2 (two) times daily with a meal.         . omeprazole (PRILOSEC) 20 MG capsule   Oral   Take 20 mg by mouth daily.           Marland Kitchen rOPINIRole (REQUIP) 1 MG tablet   Oral   Take 1 mg by mouth 2 (two) times daily.         . vitamin B-12 (CYANOCOBALAMIN) 1000 MCG tablet   Oral   Take 1,000 mcg by mouth daily.           . cyclobenzaprine (FLEXERIL) 10 MG tablet   Oral   Take 1 tablet (10  mg total) by mouth 2 (two) times daily as needed for muscle spasms.   20 tablet   0   . naproxen (NAPROSYN) 500 MG tablet   Oral   Take 1 tablet (500 mg total) by mouth 2 (two) times daily.   20 tablet   0   . predniSONE (DELTASONE) 20 MG tablet      3 tabs po day one, then 2 po daily x 4 days   11 tablet   0    BP 190/95  Pulse 75  Temp(Src) 97.8 F (36.6 C) (Oral)  Resp 20  Ht 5\' 6"  (1.676 m)  Wt 300 lb (136.079 kg)  BMI 48.44 kg/m2  SpO2 98% Physical Exam  Nursing note and vitals reviewed. Constitutional: She is oriented to person, place, and time. She appears well-developed and well-nourished.  HENT:  Head: Normocephalic and atraumatic.  Eyes: Conjunctivae and EOM are normal. Pupils are equal, round, and reactive to light.  Neck: Normal  range of motion. Neck supple.  Cardiovascular: Normal rate, regular rhythm and normal heart sounds.   Pulmonary/Chest: Effort normal and breath sounds normal.  Abdominal: Soft. Bowel sounds are normal.  Musculoskeletal:  Minimal tenderness to palpation lumbar spine  Neurological: She is alert and oriented to person, place, and time.  Patient is ambulatory without neurological deficits.  Skin: Skin is warm and dry.  Psychiatric: She has a normal mood and affect. Her behavior is normal.    ED Course  Procedures (including critical care time) Labs Review Labs Reviewed - No data to display Imaging Review Dg Lumbar Spine Complete  08/27/2013   CLINICAL DATA:  Low back pain with bilateral leg pain  EXAM: LUMBAR SPINE - COMPLETE 4+ VIEW  COMPARISON:  None.  FINDINGS: Normal alignment.  Negative for fracture or mass.  No pars defect.  Disc spaces are normal in height. Early anterior spurring at L3-4 and L4-5.  IMPRESSION: Negative.   Electronically Signed   By: Franchot Gallo M.D.   On: 08/27/2013 08:48     EKG Interpretation None      MDM   Final diagnoses:  Back pain    Plain films of lumbar spine show no acute injury.  Patient has primary care follow up.  She may need further imaging if symptoms persist. Discharge medications Naprosyn 500 mg, Flexeril 10 mg, prednisone   Nat Christen, MD 08/27/13 [phone]

## 2013-08-27 NOTE — ED Notes (Signed)
Pt feel 2 weeks ago in parking lot. Pt states lower back pain with pain radiating down both legs since the fall. Also states numbness in hands at times when she is driving

## 2013-08-27 NOTE — ED Notes (Signed)
Pt co lower back pain from fall several weeks ago in parking lot at work, has noticed numbness and tingling in both legs radiating from lower back, numbness and tingling is intermittent and extends to large toe bilaterally.

## 2013-08-27 NOTE — ED Notes (Signed)
Pt alert & oriented x4, stable gait. Patient given discharge instructions, paperwork & prescription(s). Patient  instructed to stop at the registration desk to finish any additional paperwork. Patient verbalized understanding. Pt left department w/ no further questions. 

## 2013-11-29 ENCOUNTER — Emergency Department (HOSPITAL_COMMUNITY)
Admission: EM | Admit: 2013-11-29 | Discharge: 2013-11-30 | Disposition: A | Discharge status: Home / Self Care | Payer: 59 | Attending: Emergency Medicine | Admitting: Emergency Medicine

## 2013-11-29 ENCOUNTER — Encounter (HOSPITAL_COMMUNITY): Payer: Self-pay | Admitting: Emergency Medicine

## 2013-11-29 DIAGNOSIS — L0291 Cutaneous abscess, unspecified: Secondary | ICD-10-CM

## 2013-11-29 DIAGNOSIS — F329 Major depressive disorder, single episode, unspecified: Secondary | ICD-10-CM | POA: Insufficient documentation

## 2013-11-29 DIAGNOSIS — IMO0002 Reserved for concepts with insufficient information to code with codable children: Secondary | ICD-10-CM | POA: Insufficient documentation

## 2013-11-29 DIAGNOSIS — Z79899 Other long term (current) drug therapy: Secondary | ICD-10-CM | POA: Insufficient documentation

## 2013-11-29 DIAGNOSIS — N61 Mastitis without abscess: Secondary | ICD-10-CM | POA: Insufficient documentation

## 2013-11-29 DIAGNOSIS — Z791 Long term (current) use of non-steroidal anti-inflammatories (NSAID): Secondary | ICD-10-CM | POA: Insufficient documentation

## 2013-11-29 DIAGNOSIS — F3289 Other specified depressive episodes: Secondary | ICD-10-CM | POA: Insufficient documentation

## 2013-11-29 DIAGNOSIS — E119 Type 2 diabetes mellitus without complications: Secondary | ICD-10-CM | POA: Insufficient documentation

## 2013-11-29 DIAGNOSIS — E669 Obesity, unspecified: Secondary | ICD-10-CM | POA: Insufficient documentation

## 2013-11-29 DIAGNOSIS — E7800 Pure hypercholesterolemia, unspecified: Secondary | ICD-10-CM | POA: Insufficient documentation

## 2013-11-29 MED ORDER — LIDOCAINE HCL (PF) 1 % IJ SOLN
5.0000 mL | Freq: Once | INTRAMUSCULAR | Status: AC
  Administered 2013-11-29: 5 mL via INTRADERMAL
  Filled 2013-11-29: qty 5

## 2013-11-29 MED ORDER — POVIDONE-IODINE 10 % EX SOLN
CUTANEOUS | Status: AC
  Administered 2013-11-29
  Filled 2013-11-29: qty 118

## 2013-11-29 NOTE — ED Notes (Signed)
Abscess noted left lateral breast 2 days ago, now red area has increased

## 2013-11-29 NOTE — ED Notes (Signed)
Red, swollen abscess noted to side of left breast.  Pt reports area worse over past 2 days.  Area not draining at present time.

## 2013-11-30 MED ORDER — DOXYCYCLINE HYCLATE 100 MG PO TABS
100.0000 mg | ORAL_TABLET | Freq: Once | ORAL | Status: AC
  Administered 2013-11-30: 100 mg via ORAL
  Filled 2013-11-30: qty 1

## 2013-11-30 MED ORDER — DOXYCYCLINE HYCLATE 100 MG PO CAPS: 100.0000 mg | ORAL_CAPSULE | Freq: Two times a day (BID) | ORAL | Status: DC

## 2013-11-30 MED ORDER — HYDROCODONE-ACETAMINOPHEN 5-325 MG PO TABS: ORAL_TABLET | ORAL | Status: DC

## 2013-11-30 MED ORDER — CLINDAMYCIN HCL 150 MG PO CAPS: 300.0000 mg | ORAL_CAPSULE | Freq: Once | ORAL | Status: DC

## 2013-11-30 NOTE — ED Provider Notes (Signed)
CSN: [id number]     Arrival date & time 11/29/13  2008 History   First MD Initiated Contact with Patient 11/29/13 2257     Chief Complaint  Patient presents with  . Abscess     (Consider location/radiation/quality/duration/timing/severity/associated sxs/prior Treatment) Patient is a 58 y.o. female presenting with abscess. The history is provided by the patient.  Abscess Location:  Torso Torso abscess location:  L chest Abscess quality: fluctuance, induration, painful and redness   Abscess quality: not draining and no itching   Red streaking: no   Duration:  2 days Progression:  Worsening Pain details:    Quality:  Throbbing   Severity:  Moderate   Timing:  Constant   Progression:  Unchanged Chronicity:  Recurrent Context: diabetes   Relieved by:  Nothing Worsened by:  Nothing tried Ineffective treatments:  None tried Associated symptoms: no fever, no headaches, no nausea and no vomiting   Risk factors: prior abscess     Past Medical History  Diagnosis Date  . Hypercholesterolemia   . Depression   . Diabetes mellitus without complication   . Obesity    Past Surgical History  Procedure Laterality Date  . Incise and drain abcess    . Anal fissure repair     No family history on file. History  Substance Use Topics  . Smoking status: Never Smoker   . Smokeless tobacco: Not on file  . Alcohol Use: No   OB History   Grav Para Term Preterm Abortions TAB SAB Ect Mult Living                 Review of Systems  Constitutional: Negative for fever and chills.  Respiratory: Negative for chest tightness and shortness of breath.   Gastrointestinal: Negative for nausea and vomiting.  Musculoskeletal: Negative for arthralgias and joint swelling.  Skin: Positive for color change.       Abscess   Neurological: Negative for headaches.  Hematological: Negative for adenopathy.  All other systems reviewed and are negative.     Allergies  Sulfa antibiotics and  Sulfur  Home Medications   Prior to Admission medications   Medication Sig Start Date End Date Taking? Authorizing Provider  atorvastatin (LIPITOR) 80 MG tablet Take 80 mg by mouth daily.    Historical Provider, MD  citalopram (CELEXA) 40 MG tablet Take 40 mg by mouth daily.      Historical Provider, MD  cyclobenzaprine (FLEXERIL) 10 MG tablet Take 1 tablet (10 mg total) by mouth 2 (two) times daily as needed for muscle spasms. 08/27/13   Nat Christen, MD  metFORMIN (GLUCOPHAGE) 500 MG tablet Take 500 mg by mouth 2 (two) times daily with a meal.    Historical Provider, MD  naproxen (NAPROSYN) 500 MG tablet Take 1 tablet (500 mg total) by mouth 2 (two) times daily. 08/27/13   Nat Christen, MD  omeprazole (PRILOSEC) 20 MG capsule Take 20 mg by mouth daily.      Historical Provider, MD  predniSONE (DELTASONE) 20 MG tablet 3 tabs po day one, then 2 po daily x 4 days 08/27/13   Nat Christen, MD  rOPINIRole (REQUIP) 1 MG tablet Take 1 mg by mouth 2 (two) times daily.    Historical Provider, MD  vitamin B-12 (CYANOCOBALAMIN) 1000 MCG tablet Take 1,000 mcg by mouth daily.      Historical Provider, MD   BP 157/85  Pulse 67  Temp(Src) 97.9 F (36.6 C) (Oral)  Resp 18  Ht 5\' 6"  (  1.676 m)  Wt 286 lb (129.729 kg)  BMI 46.18 kg/m2  SpO2 95% Physical Exam  Nursing note and vitals reviewed. Constitutional: She is oriented to person, place, and time. She appears well-developed and well-nourished. No distress.  HENT:  Head: Normocephalic and atraumatic.  Neck: Normal range of motion. Neck supple. No thyromegaly present.  Cardiovascular: Normal rate, regular rhythm and normal heart sounds.   No murmur heard. Pulmonary/Chest: Effort normal and breath sounds normal. No respiratory distress.  Abdominal: Soft. She exhibits no distension. There is no tenderness. There is no rebound.  Musculoskeletal: She exhibits no edema.  Neurological: She is alert and oriented to person, place, and time. She exhibits normal  muscle tone. Coordination normal.  Skin: Skin is warm and dry. There is erythema.  3 cm area of erythema and induration to the left lateral breast.  Mild surround erythema and fluctuance.  No drainage or lymphangitis.      ED Course  Procedures (including critical care time) Labs Review Labs Reviewed - No data to display  Imaging Review No results found.   EKG Interpretation None      INCISION AND DRAINAGE Performed by: Lynnett Langlinais L. Raunel Dimartino Consent: Verbal consent obtained. Risks and benefits: risks, benefits and alternatives were discussed Type: abscess  Body area: left lateral breast Anesthesia: local infiltration  Incision was made with a #11 scalpel.  Local anesthetic: lidocaine 1 % w/o epinephrine  Anesthetic total: 2 ml  Complexity: complex Blunt dissection to break up loculations  Drainage: purulent  Drainage amount: small  Packing material: 1/4 in iodoform gauze  Patient tolerance: Patient tolerated the procedure well with no immediate complications.     MDM   Final diagnoses:  Abscess    Patient is well appearing, non-toxic.  VSS.  Pt is diabetic with hx of previous abscess that required surgical drainage.  Pt comes to ED at early onset stating that she "did not want this one to get to that bad".  She agrees to close observation, doxy and f/u in 2 days or sooner for recheck.  She appears stable for d/c and agrees to plan    Zera Markwardt L. Defne Gerling, PA-C 12/02/13 1226

## 2013-11-30 NOTE — Discharge Instructions (Signed)
Abscess An abscess (boil or furuncle) is an infected area on or under the skin. This area is filled with yellowish-white fluid (pus) and other material (debris). HOME CARE   Only take medicines as told by your doctor.  If you were given antibiotic medicine, take it as directed. Finish the medicine even if you start to feel better.  If gauze is used, follow your doctor's directions for changing the gauze.  To avoid spreading the infection:  Keep your abscess covered with a bandage.  Wash your hands well.  Do not share personal care items, towels, or whirlpools with others.  Avoid skin contact with others.  Keep your skin and clothes clean around the abscess.  Keep all doctor visits as told. GET HELP RIGHT AWAY IF:   You have more pain, puffiness (swelling), or redness in the wound site.  You have more fluid or blood coming from the wound site.  You have muscle aches, chills, or you feel sick.  You have a fever. MAKE SURE YOU:   Understand these instructions.  Will watch your condition.  Will get help right away if you are not doing well or get worse. Document Released: 11/28/2007 Document Revised: 12/11/2011 Document Reviewed: 08/24/2011 [facility] Patient Information 2014 [place].  Community-Associated MRSA CA-MRSA stands for community-associated methicillin-resistant Staphylococcus aureus. MRSA is a type of bacteria that is resistant to some common antibiotics. It can cause infections in the skin and many other places in the body. Staphylococcus aureus, often called "staph," is a bacteria that normally lives on the skin or in the nose. Staph on the surface of the skin or in the nose does not cause problems. However, if the staph enters the body through a cut, wound, or break in the skin, an infection can happen. Up until recently, infections with the MRSA type of staph mainly occurred in hospitals and other healthcare settings. There are now increasing problems with  MRSA infections in the community as well. Infections with MRSA may be very serious or even life-threatening. CA-MRSA is becoming more common. It is known to spread in crowded settings, in jails and prisons, and in situations where there is close skin-to-skin contact, such as during sporting events or in locker rooms. MRSA can be spread through shared items, such as children's toys, razors, towels, or sports equipment.  CAUSES All staph, including MRSA, are normally harmless unless they enter the body through a scratch, cut, or wound, such as with surgery. All staph, including MRSA, can be spread from person-to-person by touching contaminated objects or through direct contact.  MRSA now causes illness in people who have not been in hospitals or other healthcare facilities. Cases of MRSA diseases in the community have been associated with:  Recent antibiotic use.  Sharing contaminated towels or clothes.  Having active skin diseases.  Participating in contact sports.  Living in crowded settings.  Intravenous (IV) drug use.  Community-associated MRSA infections are usually skin infections, but may cause other severe illnesses.  Staph bacteria are one of the most common causes of skin infection. However, they are also a common cause of pneumonia, bone or joint infections, and bloodstream infections. DIAGNOSIS Diagnosis of MRSA is done by cultures of fluid samples that may come from:  Swabs taken from cuts or wounds in infected areas.  Nasal swabs.  Saliva or deep cough specimens from the lungs (sputum).  Urine.  Blood. Many people are "colonized" with MRSA but have no signs of infection. This means that people carry  the MRSA germ on their skin or in their nose and may never develop MRSA infection.  TREATMENT  Treatment varies and is based on how serious, how deep, or how extensive the infection is. For example:  Some skin infections, such as a small boil or abscess, may be treated by  draining yellowish-white fluid (pus) from the site of the infection.  Deeper or more widespread soft tissue infections are usually treated with surgery to drain pus and with antibiotic medicine given by vein or by mouth. This may be recommended even if you are pregnant.  Serious infections may require a hospital stay. If antibiotics are given, they may be needed for several weeks. PREVENTION Because many people are colonized with staph, including MRSA, preventing the spread of the bacteria from person-to-person is most important. The best way to prevent the spread of bacteria and other germs is through proper hand washing or by using alcohol-based hand disinfectants. The following are other ways to help prevent MRSA infection within community settings.   Wash your hands frequently with soap and water for at least 15 seconds. Otherwise, use alcohol-based hand disinfectants when soap and water is not available.  Make sure people who live with you wash their hands often, too.  Do not share personal items. For example, avoid sharing razors and other personal hygiene items, towels, clothing, and athletic equipment.  Wash and dry your clothes and bedding at the warmest temperatures recommended on the labels.  Keep wounds covered. Pus from infected sores may contain MRSA and other bacteria. Keep cuts and abrasions clean and covered with germ-free (sterile), dry bandages until they are healed.  If you have a wound that appears infected, ask your caregiver if a culture for MRSA and other bacteria should be done.  If you are breastfeeding, talk to your caregiver about MRSA. You may be asked to temporarily stop breastfeeding. HOME CARE INSTRUCTIONS   Take your antibiotics as directed. Finish them even if you start to feel better.  Avoid close contact with those around you as much as possible. Do not use towels, razors, toothbrushes, bedding, or other items that will be used by others.  To fight the  infection, follow your caregiver's instructions for wound care. Wash your hands before and after changing your bandages.  If you have an intravascular device, such as a catheter, make sure you know how to care for it.  Be sure to tell any healthcare providers that you have MRSA so they are aware of your infection. SEEK IMMEDIATE MEDICAL CARE IF:  The infection appears to be getting worse. Signs include:  Increased warmth, redness, or tenderness around the wound site.  A red line that extends from the infection site.  A dark color in the area around the infection.  Wound drainage that is tan, yellow, or green.  A bad smell coming from the wound.  You feel sick to your stomach (nauseous) and throw up (vomit) or cannot keep medicine down.  You have a fever.  Your baby is older than 3 months with a rectal temperature of 102 F (38.9 C) or higher.  Your baby is 22 months old or younger with a rectal temperature of 100.4 F (38 C) or higher.  You have difficulty breathing. MAKE SURE YOU:   Understand these instructions.  Will watch your condition.  Will get help right away if you are not doing well or get worse. Document Released: 09/14/2005 Document Revised: 09/03/2011 Document Reviewed: 09/14/2010 [employer] Patient Information 2014  [employer]. ° °

## 2013-12-03 NOTE — ED Provider Notes (Signed)
Medical screening examination/treatment/procedure(s) were performed by non-physician practitioner and as supervising physician I was immediately available for consultation/collaboration.   EKG Interpretation None       Fredia Sorrow, MD 12/03/13 1100

## 2014-01-22 ENCOUNTER — Emergency Department (HOSPITAL_COMMUNITY)
Admission: EM | Admit: 2014-01-22 | Discharge: 2014-01-22 | Disposition: A | Discharge status: Home / Self Care | Payer: 59 | Source: Home / Self Care | Attending: Family Medicine | Admitting: Family Medicine

## 2014-01-22 ENCOUNTER — Encounter (HOSPITAL_COMMUNITY): Payer: Self-pay | Admitting: Emergency Medicine

## 2014-01-22 DIAGNOSIS — B370 Candidal stomatitis: Secondary | ICD-10-CM

## 2014-01-22 MED ORDER — NYSTATIN 100000 UNIT/ML MT SUSP: 500000.0000 [IU] | Freq: Four times a day (QID) | OROMUCOSAL | Status: DC

## 2014-01-22 NOTE — ED Notes (Signed)
Pt c/o thrush on roof of mouth/palate x 2 days Reports she's taking Antibiotics for sinus inf  Alert w/no signs of acute distress.

## 2014-01-22 NOTE — Discharge Instructions (Signed)
Thank you for coming in today. Use the mouthwash as directed for around one week Followup with your Dr. as needed. Go to the emergency room if you get dramatically worse  Candida Infection A Candida infection (also called yeast, fungus, and Monilia infection) is an overgrowth of yeast that can occur anywhere on the body. A yeast infection commonly occurs in warm, moist body areas. Usually, the infection remains localized but can spread to become a systemic infection. A yeast infection may be a sign of a more severe disease such as diabetes, leukemia, or AIDS. A yeast infection can occur in both men and women. In women, Candida vaginitis is a vaginal infection. It is one of the most common causes of vaginitis. Men usually do not have symptoms or know they have an infection until other problems develop. Men may find out they have a yeast infection because their sex partner has a yeast infection. Uncircumcised men are more likely to get a yeast infection than circumcised men. This is because the uncircumcised glans is not exposed to air and does not remain as dry as that of a circumcised glans. Older adults may develop yeast infections around dentures. CAUSES  Women  Antibiotics.  Steroid medication taken for a long time.  Being overweight (obese).  Diabetes.  Poor immune condition.  Certain serious medical conditions.  Immune suppressive medications for organ transplant patients.  Chemotherapy.  Pregnancy.  Menstruation.  Stress and fatigue.  Intravenous drug use.  Oral contraceptives.  Wearing tight-fitting clothes in the crotch area.  Catching it from a sex partner who has a yeast infection.  Spermicide.  Intravenous, urinary, or other catheters. Men  Catching it from a sex partner who has a yeast infection.  Having oral or anal sex with a person who has the infection.  Spermicide.  Diabetes.  Antibiotics.  Poor immune system.  Medications that suppress the  immune system.  Intravenous drug use.  Intravenous, urinary, or other catheters. SYMPTOMS  Women  Thick, white vaginal discharge.  Vaginal itching.  Redness and swelling in and around the vagina.  Irritation of the lips of the vagina and perineum.  Blisters on the vaginal lips and perineum.  Painful sexual intercourse.  Low blood sugar (hypoglycemia).  Painful urination.  Bladder infections.  Intestinal problems such as constipation, indigestion, bad breath, bloating, increase in gas, diarrhea, or loose stools. Men  Men may develop intestinal problems such as constipation, indigestion, bad breath, bloating, increase in gas, diarrhea, or loose stools.  Dry, cracked skin on the penis with itching or discomfort.  Jock itch.  Dry, flaky skin.  Athlete's foot.  Hypoglycemia. DIAGNOSIS  Women  A history and an exam are performed.  The discharge may be examined under a microscope.  A culture may be taken of the discharge. Men  A history and an exam are performed.  Any discharge from the penis or areas of cracked skin will be looked at under the microscope and cultured.  Stool samples may be cultured. TREATMENT  Women  Vaginal antifungal suppositories and creams.  Medicated creams to decrease irritation and itching on the outside of the vagina.  Warm compresses to the perineal area to decrease swelling and discomfort.  Oral antifungal medications.  Medicated vaginal suppositories or cream for repeated or recurrent infections.  Wash and dry the irritation areas before applying the cream.  Eating yogurt with Lactobacillus may help with prevention and treatment.  Sometimes painting the vagina with gentian violet solution may help if creams  and suppositories do not work. Men  Antifungal creams and oral antifungal medications.  Sometimes treatment must continue for 30 days after the symptoms go away to prevent recurrence. HOME CARE INSTRUCTIONS   Women  Use cotton underwear and avoid tight-fitting clothing.  Avoid colored, scented toilet paper and deodorant tampons or pads.  Do not douche.  Keep your diabetes under control.  Finish all the prescribed medications.  Keep your skin clean and dry.  Consume milk or yogurt with Lactobacillus-active culture regularly. If you get frequent yeast infections and think that is what the infection is, there are over-the-counter medications that you can get. If the infection does not show healing in 3 days, talk to your caregiver.  Tell your sex partner you have a yeast infection. Your partner may need treatment also, especially if your infection does not clear up or recurs. Men  Keep your skin clean and dry.  Keep your diabetes under control.  Finish all prescribed medications.  Tell your sex partner that you have a yeast infection so he or she can be treated if necessary. SEEK MEDICAL CARE IF:   Your symptoms do not clear up or worsen in one week after treatment.  You have an oral temperature above 102 F (38.9 C).  You have trouble swallowing or eating for a prolonged time.  You develop blisters on and around your vagina.  You develop vaginal bleeding and it is not your menstrual period.  You develop abdominal pain.  You develop intestinal problems as mentioned above.  You get weak or light-headed.  You have painful or increased urination.  You have pain during sexual intercourse. MAKE SURE YOU:   Understand these instructions.  Will watch your condition.  Will get help right away if you are not doing well or get worse. Document Released: 07/19/2004 Document Revised: 10/26/2013 Document Reviewed: 10/31/2009 [facility] Patient Information 2015 [place], [place]. This information is not intended to replace advice given to you by your health care provider. Make sure you discuss any questions you have with your health care provider.

## 2014-01-22 NOTE — ED Provider Notes (Signed)
[name] is a 58 y.o. female who presents to Urgent Care today for oral thrush. Patient has a one-day history of irritation at the palate. She recently has been treated with antibiotics for a sinus infection. She suspects that she may have developed thrush. She denies any fevers or chills nausea vomiting or diarrhea. She feels well otherwise has not tried any medications yet.   Past Medical History  Diagnosis Date  . Hypercholesterolemia   . Depression   . Diabetes mellitus without complication   . Obesity    History  Substance Use Topics  . Smoking status: Never Smoker   . Smokeless tobacco: Not on file  . Alcohol Use: No   ROS as above Medications: No current facility-administered medications for this encounter.   Current Outpatient Prescriptions  Medication Sig Dispense Refill  . atorvastatin (LIPITOR) 80 MG tablet Take 80 mg by mouth daily.      . metFORMIN (GLUCOPHAGE) 500 MG tablet Take 500 mg by mouth 2 (two) times daily with a meal.      . omeprazole (PRILOSEC) 20 MG capsule Take 20 mg by mouth daily.        . citalopram (CELEXA) 40 MG tablet Take 40 mg by mouth daily.        . cyclobenzaprine (FLEXERIL) 10 MG tablet Take 1 tablet (10 mg total) by mouth 2 (two) times daily as needed for muscle spasms.  20 tablet  0  . doxycycline (VIBRAMYCIN) 100 MG capsule Take 1 capsule (100 mg total) by mouth 2 (two) times daily. For 10 days  20 capsule  0  . HYDROcodone-acetaminophen (NORCO/VICODIN) 5-325 MG per tablet Take one-two tabs po q 4-6 hrs prn pain  15 tablet  0  . naproxen (NAPROSYN) 500 MG tablet Take 1 tablet (500 mg total) by mouth 2 (two) times daily.  20 tablet  0  . nystatin (MYCOSTATIN) 100000 UNIT/ML suspension Take 5 mLs (500,000 Units total) by mouth 4 (four) times daily.  120 mL  1  . predniSONE (DELTASONE) 20 MG tablet 3 tabs po day one, then 2 po daily x 4 days  11 tablet  0  . rOPINIRole (REQUIP) 1 MG tablet Take 1 mg by mouth 2 (two) times daily.      .  vitamin B-12 (CYANOCOBALAMIN) 1000 MCG tablet Take 1,000 mcg by mouth daily.          Exam:  BP 152/78  Pulse 72  Temp(Src) 98.4 F (36.9 C) (Oral)  Resp 16  SpO2 97% Gen: Well NAD HEENT: EOMI,  MMM palate is erythematous with white plaque consistent with thrush. Posterior pharynx is normal. Lungs: Normal work of breathing. CTABL Heart: RRR no MRG Abd: NABS, Soft. Nondistended, Nontender Exts: Brisk capillary refill, warm and well perfused.   No results found for this or any previous visit (from the past 24 hour(s)). No results found.  Assessment and Plan: 58 y.o. female with oral thrush. Plan to treat with nystatin swish and swallow.  Discussed warning signs or symptoms. Please see discharge instructions. Patient expresses understanding.   This note was created using Systems analyst. Any transcription errors are unintended.    Gregor Hams, MD 01/22/14 [phone]

## 2014-06-10 ENCOUNTER — Encounter (INDEPENDENT_AMBULATORY_CARE_PROVIDER_SITE_OTHER): Payer: Self-pay

## 2014-06-10 ENCOUNTER — Encounter: Payer: Self-pay | Admitting: Gastroenterology

## 2014-06-10 ENCOUNTER — Other Ambulatory Visit: Payer: Self-pay

## 2014-06-10 ENCOUNTER — Ambulatory Visit (INDEPENDENT_AMBULATORY_CARE_PROVIDER_SITE_OTHER): Payer: 59 | Admitting: Gastroenterology

## 2014-06-10 VITALS — BP 145/71 | HR 62 | Temp 97.1°F | Ht 67.0 in | Wt 293.6 lb

## 2014-06-10 DIAGNOSIS — Z86010 Personal history of colonic polyps: Secondary | ICD-10-CM

## 2014-06-10 MED ORDER — PEG-KCL-NACL-NASULF-NA ASC-C 100 G PO SOLR: 1.0000 | Freq: Once | ORAL | Status: AC

## 2014-06-10 NOTE — Assessment & Plan Note (Signed)
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 Overdue for surveillance colonoscopy. She is doing well from a GI standpoint.  I have discussed the risks, alternatives, benefits with regards to but not limited to the risk of reaction to medication, bleeding, infection, perforation and the patient is agreeable to proceed. Written consent to be obtained.

## 2014-06-10 NOTE — Progress Notes (Signed)
Primary Care Physician:  KNOWLTON,STEPHEN D, MD  Primary Gastroenterologist:  Michael Rourk, MD   Chief Complaint  Patient presents with  . Colonoscopy    HPI:  [name] is a 58 y.o. female here to schedule surveillance colonoscopy. Last colonoscopy in July 2010. Rectal polyp, tubulovillous adenoma, multiple tubular adenomas as well. She was advised come back in 2013 for surveillance exam but she has been putting it off. She had sigmoidoscopy in September 2011 for rectal pain noted to have tender anal canal on exam, normal exam to 40 cm. Occult anal fissure was suspected. She ended up having surgery with Dr. Jenkins.  At this time she's feeling well. Bowel movements are regular. Denies blood in the stool or melena. No heartburn as long she takes Prilosec. She's had GERD for several years. No prior upper endoscopy and refuses EGD at this time. Denies any dysphagia, vomiting, weight loss.  Current Outpatient Prescriptions  Medication Sig Dispense Refill  . atorvastatin (LIPITOR) 80 MG tablet Take 80 mg by mouth daily.    . citalopram (CELEXA) 40 MG tablet Take 40 mg by mouth daily.      . docusate sodium (COLACE) 100 MG capsule Take 100 mg by mouth 2 (two) times daily.    . metFORMIN (GLUCOPHAGE) 500 MG tablet Take 500 mg by mouth 4 (four) times daily.     . omeprazole (PRILOSEC) 20 MG capsule Take 20 mg by mouth daily.      . vitamin B-12 (CYANOCOBALAMIN) 1000 MCG tablet Take 1,000 mcg by mouth daily.       No current facility-administered medications for this visit.    Allergies as of 06/10/2014 - Review Complete 06/10/2014  Allergen Reaction Noted  . Sulfa antibiotics Hives 08/05/2013  . Sulfur Hives 08/05/2013    Past Medical History  Diagnosis Date  . Hypercholesterolemia   . Depression   . Diabetes mellitus without complication   . Obesity     Past Surgical History  Procedure Laterality Date  . Incise and drain abcess    . Anal fissure repair  2012    Dr.  Jenkins  . Colonoscopy  01/29/2003    NUR: Five small polyps that were ablated/Tiny diverticula at sigmoid colon  . Colonoscopy  03/03/2010    RMR:anal canal papilla tender anal canal otherwise normal    Family History  Problem Relation Age of Onset  . Colon cancer Neg Hx     History   Social History  . Marital Status: Married    Spouse Name: N/A    Number of Children: N/A  . Years of Education: N/A   Occupational History  . Not on file.   Social History Main Topics  . Smoking status: Never Smoker   . Smokeless tobacco: Not on file  . Alcohol Use: No  . Drug Use: No  . Sexual Activity: Not on file   Other Topics Concern  . Not on file   Social History Narrative      ROS:  General: Negative for anorexia, weight loss, fever, chills, fatigue, weakness. Eyes: Negative for vision changes.  ENT: Negative for hoarseness, difficulty swallowing , nasal congestion. CV: Negative for chest pain, angina, palpitations, dyspnea on exertion, peripheral edema.  Respiratory: Negative for dyspnea at rest, dyspnea on exertion, cough, sputum, wheezing.  GI: See history of present illness. GU:  Negative for dysuria, hematuria, urinary incontinence, urinary frequency, nocturnal urination.  MS: Negative for joint pain, low back pain.  Derm: Negative for rash   or itching.  Neuro: Negative for weakness, abnormal sensation, seizure, frequent headaches, memory loss, confusion.  Psych: Negative for anxiety, depression, suicidal ideation, hallucinations.  Endo: Negative for unusual weight change.  Heme: Negative for bruising or bleeding. Allergy: Negative for rash or hives.    Physical Examination:  BP 145/71 mmHg  Pulse 62  Temp(Src) 97.1 F (36.2 C) (Oral)  Ht 5' 7" (1.702 m)  Wt 293 lb 9.6 oz (133.176 kg)  BMI 45.97 kg/m2   General: Well-nourished, well-developed in no acute distress.  Head: Normocephalic, atraumatic.   Eyes: Conjunctiva pink, no icterus. Mouth: Oropharyngeal  mucosa moist and pink , no lesions erythema or exudate. Neck: Supple without thyromegaly, masses, or lymphadenopathy.  Lungs: Clear to auscultation bilaterally.  Heart: Regular rate and rhythm, no murmurs rubs or gallops.  Abdomen: Bowel sounds are normal, nontender, nondistended, no hepatosplenomegaly or masses, no abdominal bruits or    hernia , no rebound or guarding.   Rectal: Deferred Extremities: No lower extremity edema. No clubbing or deformities.  Neuro: Alert and oriented x 4 , grossly normal neurologically.  Skin: Warm and dry, no rash or jaundice.   Psych: Alert and cooperative, normal mood and affect.   Imaging Studies: No results found.    

## 2014-06-10 NOTE — Patient Instructions (Signed)
Colonoscopy as scheduled. See separate instructions.  

## 2014-06-21 ENCOUNTER — Ambulatory Visit (HOSPITAL_COMMUNITY)
Admission: RE | Admit: 2014-06-21 | Discharge: 2014-06-21 | Disposition: A | Discharge status: Home / Self Care | Payer: 59 | Source: Ambulatory Visit | Attending: Internal Medicine | Admitting: Internal Medicine

## 2014-06-21 ENCOUNTER — Encounter (HOSPITAL_COMMUNITY): Payer: Self-pay | Admitting: *Deleted

## 2014-06-21 ENCOUNTER — Encounter (HOSPITAL_COMMUNITY)
Admission: RE | Disposition: A | Discharge status: Home / Self Care | Payer: Self-pay | Source: Ambulatory Visit | Attending: Internal Medicine

## 2014-06-21 DIAGNOSIS — E780 Pure hypercholesterolemia: Secondary | ICD-10-CM | POA: Diagnosis not present

## 2014-06-21 DIAGNOSIS — K621 Rectal polyp: Secondary | ICD-10-CM | POA: Diagnosis not present

## 2014-06-21 DIAGNOSIS — F329 Major depressive disorder, single episode, unspecified: Secondary | ICD-10-CM | POA: Insufficient documentation

## 2014-06-21 DIAGNOSIS — Z79899 Other long term (current) drug therapy: Secondary | ICD-10-CM | POA: Diagnosis not present

## 2014-06-21 DIAGNOSIS — Z6841 Body Mass Index (BMI) 40.0 and over, adult: Secondary | ICD-10-CM | POA: Diagnosis not present

## 2014-06-21 DIAGNOSIS — Z882 Allergy status to sulfonamides status: Secondary | ICD-10-CM | POA: Diagnosis not present

## 2014-06-21 DIAGNOSIS — Z860100 Personal history of colon polyps, unspecified: Secondary | ICD-10-CM | POA: Insufficient documentation

## 2014-06-21 DIAGNOSIS — E669 Obesity, unspecified: Secondary | ICD-10-CM | POA: Diagnosis not present

## 2014-06-21 DIAGNOSIS — D120 Benign neoplasm of cecum: Secondary | ICD-10-CM | POA: Insufficient documentation

## 2014-06-21 DIAGNOSIS — E119 Type 2 diabetes mellitus without complications: Secondary | ICD-10-CM | POA: Insufficient documentation

## 2014-06-21 DIAGNOSIS — K219 Gastro-esophageal reflux disease without esophagitis: Secondary | ICD-10-CM | POA: Diagnosis not present

## 2014-06-21 HISTORY — PX: COLONOSCOPY: SHX5424

## 2014-06-21 LAB — GLUCOSE, CAPILLARY: Glucose-Capillary: 137 mg/dL — ABNORMAL HIGH (ref 70–99)

## 2014-06-21 SURGERY — COLONOSCOPY
Anesthesia: Moderate Sedation

## 2014-06-21 MED ORDER — MIDAZOLAM HCL 5 MG/5ML IJ SOLN
INTRAMUSCULAR | Status: AC
  Filled 2014-06-21: qty 10

## 2014-06-21 MED ORDER — ONDANSETRON HCL 4 MG/2ML IJ SOLN
INTRAMUSCULAR | Status: DC | PRN
  Administered 2014-06-21: 4 mg via INTRAVENOUS

## 2014-06-21 MED ORDER — MEPERIDINE HCL 100 MG/ML IJ SOLN
INTRAMUSCULAR | Status: AC
  Filled 2014-06-21: qty 2

## 2014-06-21 MED ORDER — MEPERIDINE HCL 100 MG/ML IJ SOLN
INTRAMUSCULAR | Status: DC | PRN
  Administered 2014-06-21: 50 mg via INTRAVENOUS

## 2014-06-21 MED ORDER — ONDANSETRON HCL 4 MG/2ML IJ SOLN
INTRAMUSCULAR | Status: AC
  Filled 2014-06-21: qty 2

## 2014-06-21 MED ORDER — SODIUM CHLORIDE 0.9 % IV SOLN
INTRAVENOUS | Status: DC
  Administered 2014-06-21: 1000 mL via INTRAVENOUS

## 2014-06-21 MED ORDER — MIDAZOLAM HCL 5 MG/5ML IJ SOLN
INTRAMUSCULAR | Status: DC | PRN
Start: 2014-06-21 — End: 2014-06-21
  Administered 2014-06-21 (×2): 1 mg via INTRAVENOUS
  Administered 2014-06-21: 2 mg via INTRAVENOUS
  Administered 2014-06-21: 1 mg via INTRAVENOUS

## 2014-06-21 MED ORDER — SIMETHICONE 40 MG/0.6ML PO SUSP
ORAL | Status: DC | PRN
  Administered 2014-06-21: 08:00:00

## 2014-06-21 NOTE — Interval H&P Note (Signed)
History and Physical Interval Note:  06/21/2014 7:58 AM  [name]  has presented today for surgery, with the diagnosis of HISTORY OF COLON POLYPS  The various methods of treatment have been discussed with the patient and family. After consideration of risks, benefits and other options for treatment, the patient has consented to  Procedure(s) with comments: COLONOSCOPY (N/A) - 745AM as a surgical intervention .  The patient's history has been reviewed, patient examined, no change in status, stable for surgery.  I have reviewed the patient's chart and labs.  Questions were answered to the patient's satisfaction.     Manus Rudd Patient denies any bowel symptoms. No change. Surveillance colonoscopy per plan.The risks, benefits, limitations, alternatives and imponderables have been reviewed with the patient. Questions have been answered. All parties are agreeable.

## 2014-06-21 NOTE — Discharge Instructions (Signed)
°Colonoscopy °Discharge Instructions ° °Read the instructions outlined below and refer to this sheet in the next few weeks. These discharge instructions provide you with general information on caring for yourself after you leave the hospital. Your doctor may also give you specific instructions. While your treatment has been planned according to the most current medical practices available, unavoidable complications occasionally occur. If you have any problems or questions after discharge, call Dr. Rourk at [phone]. °ACTIVITY °· You may resume your regular activity, but move at a slower pace for the next 24 hours.  °· Take frequent rest periods for the next 24 hours.  °· Walking will help get rid of the air and reduce the bloated feeling in your belly (abdomen).  °· No driving for 24 hours (because of the medicine (anesthesia) used during the test).   °· Do not sign any important legal documents or operate any machinery for 24 hours (because of the anesthesia used during the test).  °NUTRITION °· Drink plenty of fluids.  °· You may resume your normal diet as instructed by your doctor.  °· Begin with a light meal and progress to your normal diet. Heavy or fried foods are harder to digest and may make you feel sick to your stomach (nauseated).  °· Avoid alcoholic beverages for 24 hours or as instructed.  °MEDICATIONS °· You may resume your normal medications unless your doctor tells you otherwise.  °WHAT YOU CAN EXPECT TODAY °· Some feelings of bloating in the abdomen.  °· Passage of more gas than usual.  °· Spotting of blood in your stool or on the toilet paper.  °IF YOU HAD POLYPS REMOVED DURING THE COLONOSCOPY: °· No aspirin products for 7 days or as instructed.  °· No alcohol for 7 days or as instructed.  °· Eat a soft diet for the next 24 hours.  °FINDING OUT THE RESULTS OF YOUR TEST °Not all test results are available during your visit. If your test results are not back during the visit, make an appointment  with your caregiver to find out the results. Do not assume everything is normal if you have not heard from your caregiver or the medical facility. It is important for you to follow up on all of your test results.  °SEEK IMMEDIATE MEDICAL ATTENTION IF: °· You have more than a spotting of blood in your stool.  °· Your belly is swollen (abdominal distention).  °· You are nauseated or vomiting.  °· You have a temperature over 101.  °· You have abdominal pain or discomfort that is severe or gets worse throughout the day.  ° ° °Polyp information provided ° °Further recommendations to follow pending review of pathology report ° °Colon Polyps °Polyps are lumps of extra tissue growing inside the body. Polyps can grow in the large intestine (colon). Most colon polyps are noncancerous (benign). However, some colon polyps can become cancerous over time. Polyps that are larger than a pea may be harmful. To be safe, caregivers remove and test all polyps. °CAUSES  °Polyps form when mutations in the genes cause your cells to grow and divide even though no more tissue is needed. °RISK FACTORS °There are a number of risk factors that can increase your chances of getting colon polyps. They include: °· Being older than 50 years. °· Family history of colon polyps or colon cancer. °· Long-term colon diseases, such as colitis or Crohn disease. °· Being overweight. °· Smoking. °· Being inactive. °· Drinking too much alcohol. °SYMPTOMS  °  Most small polyps do not cause symptoms. If symptoms are present, they may include: °· Blood in the stool. The stool may look dark red or black. °· Constipation or diarrhea that lasts longer than 1 week. °DIAGNOSIS °People often do not know they have polyps until their caregiver finds them during a regular checkup. Your caregiver can use 4 tests to check for polyps: °· Digital rectal exam. The caregiver wears gloves and feels inside the rectum. This test would find polyps only in the rectum. °· Barium  enema. The caregiver puts a liquid called barium into your rectum before taking X-rays of your colon. Barium makes your colon look white. Polyps are dark, so they are easy to see in the X-ray pictures. °· Sigmoidoscopy. A thin, flexible tube (sigmoidoscope) is placed into your rectum. The sigmoidoscope has a light and tiny camera in it. The caregiver uses the sigmoidoscope to look at the last third of your colon. °· Colonoscopy. This test is like sigmoidoscopy, but the caregiver looks at the entire colon. This is the most common method for finding and removing polyps. °TREATMENT  °Any polyps will be removed during a sigmoidoscopy or colonoscopy. The polyps are then tested for cancer. °PREVENTION  °To help lower your risk of getting more colon polyps: °· Eat plenty of fruits and vegetables. Avoid eating fatty foods. °· Do not smoke. °· Avoid drinking alcohol. °· Exercise every day. °· Lose weight if recommended by your caregiver. °· Eat plenty of calcium and folate. Foods that are rich in calcium include milk, cheese, and broccoli. Foods that are rich in folate include chickpeas, kidney beans, and spinach. °HOME CARE INSTRUCTIONS °Keep all follow-up appointments as directed by your caregiver. You may need periodic exams to check for polyps. °SEEK MEDICAL CARE IF: °You notice bleeding during a bowel movement. °Document Released: 03/07/2004 Document Revised: 09/03/2011 Document Reviewed: 08/21/2011 °ExitCare® Patient Information ©2015 [employer]. This information is not intended to replace advice given to you by your health care provider. Make sure you discuss any questions you have with your health care provider. ° °

## 2014-06-21 NOTE — Op Note (Signed)
[facility] [address] [place], [address]   COLONOSCOPY PROCEDURE REPORT  PATIENT: [name], [name]  MR#: [id number] BIRTHDATE: 1956-03-11 , 26  yrs. old GENDER: female ENDOSCOPIST: R.  Garfield Cornea, MD FACP [facility] REFERRED FU:XNATF Karie Kirks, M.D. PROCEDURE DATE:  2014/07/14 PROCEDURE:   Colonoscopy with snare polypectomy INDICATIONS:History of advanced adenoma previously; surveillance examination. MEDICATIONS: Versed 5 mg IV and Demerol 50 mg IV in divided doses. Xylocaine gel orally.  Zofran 4 mg IV. ASA CLASS:       Class II  CONSENT: The risks, benefits, alternatives and imponderables including but not limited to bleeding, perforation as well as the possibility of a missed lesion have been reviewed.  The potential for biopsy, lesion removal, etc. have also been discussed. Questions have been answered.  All parties agreeable.  Please see the history and physical in the medical record for more information.  DESCRIPTION OF PROCEDURE:   After the risks benefits and alternatives of the procedure were thoroughly explained, informed consent was obtained.  The digital rectal exam revealed no abnormalities of the rectum.   The EC-[id number] ([id number])  endoscope was introduced through the anus and advanced to the   . No adverse events experienced.   The quality of the prep was adequate.  The instrument was then slowly withdrawn as the colon was fully examined.      COLON FINDINGS: (1) 4 mm polyp in the rectum at 10 cm from anal verge.  Otherwise, a thorough survey of the rectal mucosa including retroflexion demonstrated no mucosal abnormalities.  The patient had (1) 8 mm polyp in the base of the cecum with an adjacent 4 mm polyp.  There was another 4 mm polyp at the splenic flexure otherwise, the remainder of the colonic mucosa appeared normal.  The rectal and larger cecal polyp were hot snare removed.  The smaller cecal polyp as well as the splenic flexure  polyp were cold snare removed.  The rectal polyp was destroyed with removal and not recovered.  Retroflexed views revealed no abnormalities. .  Withdrawal time=20 minutes 0 seconds.  The scope was withdrawn and the procedure completed. COMPLICATIONS: There were no immediate complications.  ENDOSCOPIC IMPRESSION: Multiple colonic and rectal polyps?"removed as described above?"the small rectal polyp did not survive  RECOMMENDATIONS: Follow up on pathology.  eSigned:  R. Garfield Cornea, MD Rosalita [facility]ss[facility] 07/14/2014 8:48 AM   cc:  CPT CODES: ICD CODES:  The ICD and CPT codes recommended by this software are interpretations from the data that the clinical staff has captured with the software.  The verification of the translation of this report to the ICD and CPT codes and modifiers is the sole responsibility of the health care institution and practicing physician where this report was generated.  [employer]. will not be held responsible for the validity of the ICD and CPT codes included on this report.  AMA assumes no liability for data contained or not contained herein. CPT is a Designer, television/film set of the [employer].

## 2014-06-21 NOTE — H&P (View-Only) (Signed)
Primary Care Physician:  Robert Bellow, MD  Primary Gastroenterologist:  Garfield Cornea, MD   Chief Complaint  Patient presents with  . Colonoscopy    HPI:  [name] is a 58 y.o. female here to schedule surveillance colonoscopy. Last colonoscopy in July 2010. Rectal polyp, tubulovillous adenoma, multiple tubular adenomas as well. She was advised come back in 2013 for surveillance exam but she has been putting it off. She had sigmoidoscopy in September 2011 for rectal pain noted to have tender anal canal on exam, normal exam to 40 cm. Occult anal fissure was suspected. She ended up having surgery with Dr. Arnoldo Morale.  At this time she's feeling well. Bowel movements are regular. Denies blood in the stool or melena. No heartburn as long she takes Prilosec. She's had GERD for several years. No prior upper endoscopy and refuses EGD at this time. Denies any dysphagia, vomiting, weight loss.  Current Outpatient Prescriptions  Medication Sig Dispense Refill  . atorvastatin (LIPITOR) 80 MG tablet Take 80 mg by mouth daily.    . citalopram (CELEXA) 40 MG tablet Take 40 mg by mouth daily.      Marland Kitchen docusate sodium (COLACE) 100 MG capsule Take 100 mg by mouth 2 (two) times daily.    . metFORMIN (GLUCOPHAGE) 500 MG tablet Take 500 mg by mouth 4 (four) times daily.     Marland Kitchen omeprazole (PRILOSEC) 20 MG capsule Take 20 mg by mouth daily.      . vitamin B-12 (CYANOCOBALAMIN) 1000 MCG tablet Take 1,000 mcg by mouth daily.       No current facility-administered medications for this visit.    Allergies as of 06/10/2014 - Review Complete 06/10/2014  Allergen Reaction Noted  . Sulfa antibiotics Hives 08/05/2013  . Sulfur Hives 08/05/2013    Past Medical History  Diagnosis Date  . Hypercholesterolemia   . Depression   . Diabetes mellitus without complication   . Obesity     Past Surgical History  Procedure Laterality Date  . Incise and drain abcess    . Anal fissure repair  2012    Dr.  Arnoldo Morale  . Colonoscopy  01/29/2003    NUR: Five small polyps that were ablated/Tiny diverticula at sigmoid colon  . Colonoscopy  03/03/2010    PFX:TKWI canal papilla tender anal canal otherwise normal    Family History  Problem Relation Age of Onset  . Colon cancer Neg Hx     History   Social History  . Marital Status: Married    Spouse Name: N/A    Number of Children: N/A  . Years of Education: N/A   Occupational History  . Not on file.   Social History Main Topics  . Smoking status: Never Smoker   . Smokeless tobacco: Not on file  . Alcohol Use: No  . Drug Use: No  . Sexual Activity: Not on file   Other Topics Concern  . Not on file   Social History Narrative      ROS:  General: Negative for anorexia, weight loss, fever, chills, fatigue, weakness. Eyes: Negative for vision changes.  ENT: Negative for hoarseness, difficulty swallowing , nasal congestion. CV: Negative for chest pain, angina, palpitations, dyspnea on exertion, peripheral edema.  Respiratory: Negative for dyspnea at rest, dyspnea on exertion, cough, sputum, wheezing.  GI: See history of present illness. GU:  Negative for dysuria, hematuria, urinary incontinence, urinary frequency, nocturnal urination.  MS: Negative for joint pain, low back pain.  Derm: Negative for rash  or itching.  Neuro: Negative for weakness, abnormal sensation, seizure, frequent headaches, memory loss, confusion.  Psych: Negative for anxiety, depression, suicidal ideation, hallucinations.  Endo: Negative for unusual weight change.  Heme: Negative for bruising or bleeding. Allergy: Negative for rash or hives.    Physical Examination:  BP 145/71 mmHg  Pulse 62  Temp(Src) 97.1 F (36.2 C) (Oral)  Ht 5\' 7"  (1.702 m)  Wt 293 lb 9.6 oz (133.176 kg)  BMI 45.97 kg/m2   General: Well-nourished, well-developed in no acute distress.  Head: Normocephalic, atraumatic.   Eyes: Conjunctiva pink, no icterus. Mouth: Oropharyngeal  mucosa moist and pink , no lesions erythema or exudate. Neck: Supple without thyromegaly, masses, or lymphadenopathy.  Lungs: Clear to auscultation bilaterally.  Heart: Regular rate and rhythm, no murmurs rubs or gallops.  Abdomen: Bowel sounds are normal, nontender, nondistended, no hepatosplenomegaly or masses, no abdominal bruits or    hernia , no rebound or guarding.   Rectal: Deferred Extremities: No lower extremity edema. No clubbing or deformities.  Neuro: Alert and oriented x 4 , grossly normal neurologically.  Skin: Warm and dry, no rash or jaundice.   Psych: Alert and cooperative, normal mood and affect.   Imaging Studies: No results found.

## 2014-06-22 ENCOUNTER — Encounter (HOSPITAL_COMMUNITY): Payer: Self-pay | Admitting: Internal Medicine

## 2014-06-23 ENCOUNTER — Encounter: Payer: Self-pay | Admitting: Internal Medicine

## 2014-06-24 NOTE — Progress Notes (Signed)
cc'ed to pcp °

## 2014-11-15 ENCOUNTER — Encounter: Payer: Self-pay | Admitting: *Deleted

## 2014-11-15 ENCOUNTER — Other Ambulatory Visit: Payer: Self-pay | Admitting: *Deleted

## 2014-11-23 NOTE — Patient Outreach (Addendum)
[facility]) Care Management   11/15/14  [name] 08/16/1955 [id number]  [name] is an 59 y.o. female who presents for routine Link To Wellness follow up for self management assistance of Type II DM.  Subjective:  [name] voices no complaints. States she recently saw Dr. Karie Kirks and her A1C was 6.9%. Says her blood sugars vary from 101-223 and she usually checks post prandial to help her with food choices. She has chronic left big toe numbness but no other complaints of diabetic neuropathy. She continues to attend Taking [employer] (TOPS) every Monday night and is weighed at that time. She is aware she will have to change to a different brand of glucometer so that she can get the testing supplies at no cost. She says she will change to the True Metrix.  Objective:   Review of Systems  Constitutional: Negative.     Physical Exam  Constitutional: She is oriented to person, place, and time. She appears well-developed and well-nourished.  Neurological: She is alert and oriented to person, place, and time.  Skin:     Psychiatric: She has a normal mood and affect. Her behavior is normal. Judgment and thought content normal.   Filed Vitals:   11/15/14 1612  BP: 110/80   Filed Weights   11/15/14 1612  Weight: 284 lb (128.822 kg)   Current Medications:   Current Outpatient Prescriptions  Medication Sig Dispense Refill  . atorvastatin (LIPITOR) 80 MG tablet Take 80 mg by mouth daily.    . Cholecalciferol (VITAMIN D PO) Take 1 tablet by mouth daily.    . citalopram (CELEXA) 40 MG tablet Take 40 mg by mouth daily.      Marland Kitchen docusate sodium (COLACE) 100 MG capsule Take 100 mg by mouth 2 (two) times daily.    . metFORMIN (GLUCOPHAGE) 500 MG tablet Take 1,000 mg by mouth 2 (two) times daily.     Marland Kitchen omeprazole (PRILOSEC) 20 MG capsule Take 20 mg by mouth daily.      . vitamin B-12 (CYANOCOBALAMIN) 1000 MCG tablet Take 1,000 mcg by mouth daily.        No current facility-administered medications for this visit.    Functional Status:   In your present state of health, do you have any difficulty performing the following activities: 11/15/2014  Hearing? N  Vision? N  Difficulty concentrating or making decisions? N  Walking or climbing stairs? N  Dressing or bathing? N  Doing errands, shopping? N    Fall/Depression Screening:    PHQ 2/9 Scores 11/15/2014  PHQ - 2 Score 0   THN CM Care Plan Problem One        Patient Outreach from 11/15/2014 in [place] Problem One  Type II DM currently meeting A1C target as evidenced by A1C= 6.9% at most recent check   Care Plan for Problem One  Active   THN Long Term Goal (31-90 days)  Onging good control of Type II DM as evidenced by A1C meeting target of <7.0% at next check   [place] Term Goal Start Date  11/15/14   Interventions for Problem One Long Term Goal  using a picture representation, reviewed the 8 core pathophysiologic deficits in Type II diabetes. discussed physiology of diabetes as a chronic progressive disease with the initial problem of insulin resistance in the muscle, liver and fat cells and then increased loss of beta cell function over time resulting in decreased  insulin production, discussed role of obesity/being overweight, especially central obesity, on insulin resistance, reviewed patient medications, discussed DM medication of Metformin including the mechanism of action, common side effects, dosages and dosing schedule, reinforced importance of taking all medications as prescribed, discussed the need for the use of a combination of DM medications to correct the pathophysiologic core deficits and to  prevent or slow beta cell failure, reviewed information on the three primary macronutrients (CHO, protein, fat) and their effect on glucose levels, reviewed approximate amount of CHOs to aim for at meals ( 30-45 gm ) and snacks (15 gms), discussed recommended  daily amounts of fiber (30 gm)  and protein (46 gm for adult women) and how these two macronutrients help with satiety, discussed effects of physical activity on glucose levels and long-term glucose control by improving insulin sensitivity and assisting with weight management and cardiovascular health, discussed exercise opportunities offered by [facility]. encouraged patient to continue to exercise,  reviewed American Diabetic Association recommendations of 150 minutes of exercise per week including two sessions of resistance exercise weekly, reviewed patient's meter history and CBG log, discussed blood glucose monitoring and interpretation, discussed recommended targe     Assessment:   Link To Wellness member with Type II DM currently meeting A1C target.  Plan:   RNCM to fax today's office visit note to Dr. Karie Kirks. RNCM will meet quarterly and as needed with patient per Link To Wellness program guidelines to assist with Type II DM self-management and assess patient's progress toward mutually set goals.  Barrington Ellison RN,CCM,CDE [place] Management Coordinator Link To Wellness Office Phone [phone] Office Fax [phone]

## 2015-03-21 ENCOUNTER — Ambulatory Visit: Payer: Self-pay | Admitting: *Deleted

## 2015-03-23 ENCOUNTER — Other Ambulatory Visit: Payer: Self-pay | Admitting: *Deleted

## 2015-03-23 ENCOUNTER — Encounter: Payer: Self-pay | Admitting: *Deleted

## 2015-03-23 NOTE — Patient Outreach (Addendum)
[name]) Care Management   03/23/2015  [name] 1955-09-08 [id number]  [name] is an 59 y.o. female who presents to the [place] Management office for routine Link To Wellness follow up for self management assistance with Type II DM and hyperlipidemia.  Subjective:  [name] has no complaints. She says she is under some stress due to work issues, her department is not fully staffed. She says she got her True Metrix glucometer and test strips from the OP pharmacy at no cost and she thinks her blood sugars are lower than with the previous True Result meter. She continues to check her blood sugar two hours after eating supper every day. She reports no episodes of hypoglycemia and defines her hypoglycemic threshold at a blood sugar of 90 or less. 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 She continues to participate in TOPS (Taking Off Pounds Sensibly). She says she is consistently adherent with taking her medications as prescribed.  Objective:   Review of Systems  Constitutional: Negative.     Physical Exam  Constitutional: She is oriented to person, place, and time. She appears well-developed and well-nourished.  Neurological: She is alert and oriented to person, place, and time.  Skin: Skin is warm and dry.  Psychiatric: She has a normal mood and affect. Her behavior is normal. Judgment and thought content normal.   Filed Weights   03/23/15 1613  Weight: 291 lb (131.997 kg)   Filed Vitals:   03/23/15 1613  BP: 118/80   Current Medications:   Current Outpatient Prescriptions  Medication Sig Dispense Refill  . atorvastatin (LIPITOR) 80 MG tablet Take 80 mg by mouth daily.    . Cholecalciferol (VITAMIN D PO) Take 1 tablet by mouth daily.    . citalopram (CELEXA) 40 MG tablet Take 40 mg by mouth daily.      Marland Kitchen docusate sodium (COLACE) 100 MG capsule Take 100 mg by mouth 2 (two)  times daily.    . metFORMIN (GLUCOPHAGE) 500 MG tablet Take 1,000 mg by mouth 2 (two) times daily.     Marland Kitchen omeprazole (PRILOSEC) 20 MG capsule Take 20 mg by mouth daily.      . vitamin B-12 (CYANOCOBALAMIN) 1000 MCG tablet Take 1,000 mcg by mouth daily.       No current facility-administered medications for this visit.    Functional Status:   In your present state of health, do you have any difficulty performing the following activities: 03/23/2015 11/15/2014  Hearing? N N  Vision? N N  Difficulty concentrating or making decisions? N N  Walking or climbing stairs? N N  Dressing or bathing? N N  Doing errands, shopping? N N    Fall/Depression Screening:    PHQ 2/9 Scores 11/15/2014  PHQ - 2 Score 0    Assessment:   [employer] employee and Link To Wellness member with Type II DM and hyperlipidemia with post prandial blood sugars meeting target >80%.  Plan:  [facility] CM Care Plan Problem One        Most Recent Value   Care Plan Problem One  Type II DM currently meeting A1C target as evidenced by A1C= 6.9% at most recent check   Role Documenting the Problem One  Care Management [place] for Problem One  Active   THN Long Term Goal (31-90 days)  Onging good control of Type II DM as  evidenced by A1C meeting target of <7.0% at next check   [place] Term Goal Start Date  03/23/15   Interventions for Problem One Long Term Goal  using a picture representation, reviewed the 8 core pathophysiologic deficits in Type II diabetes. discussed physiology of diabetes as a chronic progressive disease with the initial problem of insulin resistance in the muscle, liver and fat cells and then increased loss of beta cell function over time resulting in decreased insulin production, discussed role of obesity, especially central obesity, on insulin resistance, reviewed patient medications, discussed DM medication of Metformin including the mechanism of action, common side effects, dosages and dosing  schedule, reinforced importance of taking all medications as prescribed,  reviewed information on the three primary macronutrients (CHO, protein, fat) and their effect on glucose levels, discussed effects of physical activity on glucose levels and long-term glucose control by improving insulin sensitivity and assisting with weight management and cardiovascular health,  encouraged patient to continue to exercise,  reviewed patient's meter history and CBG log, showed patient how to do a control test to assess accuracy of her meter, discussed blood glucose monitoring and interpretation, discussed recommended targets for pre and post prandial, requested she get latest lab results from Dr. Karie Kirks so that results can be reviewed at her next Link To Wellness visit, arranged for next Link To Wellness visit in December 2016      RNCM to fax today's office visit note to Dr. Karie Kirks. Will request most recent lab results from Dr. Vickey Sages office. RNCM will meet quarterly and as needed with patient per Link To Wellness program guidelines to assist with Type II DM self-management and assess patient's progress toward mutually set goals  Barrington Ellison RN,CCM,CDE [place] Management Coordinator Link To Wellness Office Phone [phone] Office Fax [phone]

## 2015-06-13 ENCOUNTER — Other Ambulatory Visit: Payer: Self-pay | Admitting: *Deleted

## 2015-06-13 NOTE — Patient Outreach (Signed)
[name] did not keep her 4:00 pm Link To Wellness appointment today. Barrington Ellison RN,CCM,CDE [place] Management Coordinator Link To Wellness Office Phone [phone] Office Fax [phone]

## 2015-06-14 ENCOUNTER — Other Ambulatory Visit: Payer: Self-pay | Admitting: *Deleted

## 2015-06-14 NOTE — Patient Outreach (Signed)
Called Jamyrah's home number and left message with her husband Eulas Post asking her to call and reschedule her Link To Wellness appointment that she missed yesterday. Barrington Ellison RN,CCM,CDE [place] Management Coordinator Link To Wellness Office Phone [phone] Office Fax [phone]

## 2015-07-15 ENCOUNTER — Other Ambulatory Visit: Payer: Self-pay | Admitting: *Deleted

## 2015-07-15 NOTE — Patient Outreach (Signed)
Per request from Shandria, left message on her home phone advising her of her Link To Wellness appointment scheduled for 08/03/15 at 4:00 pm. Barrington Ellison RN,CCM,CDE [place] Management Coordinator Link To Wellness Office Phone [phone] Office Fax [phone]

## 2015-07-21 MED FILL — TRUE METRIX GLUCOSE TEST ST: 90 days supply | Qty: 200 | Fill #1

## 2015-07-21 MED FILL — TRUEplus LANCETS 30G MISC: 90 days supply | Qty: 200 | Fill #1

## 2015-08-03 ENCOUNTER — Other Ambulatory Visit: Payer: Self-pay | Admitting: *Deleted

## 2015-08-04 ENCOUNTER — Encounter: Payer: Self-pay | Admitting: *Deleted

## 2015-08-04 NOTE — Patient Outreach (Signed)
[facility]) Care Management   08/03/15  [name] 1955-09-29 [id number]  [name] is an 60 y.o. female who presents to the [place] Management office for routine Link To Wellness follow up for self management assistance with Type II DM and hyperlipidemia.  Subjective:  [name] says she has several family members that received a cancer diagnosis or have died since her last Link To Wellness visit. Her sister in law died from pancreatitis , her brother was diagnosed with bone marrow cancer and her stepfather was diagnosed with prostate cancer. She says her husband is doing OK but he has chronic health issues and is legally blind and is hearing impaired. Novena says she saw Dr Karie Kirks recently and was told to increase her walking time so she now walks for 30 minutes in the hospital on her work days prior to starting her shift.  She says she had lab work done but does not know the results. She continues to go to [employer] [employer] every Sunday evening. She consistently checks her blood sugar every evening after supper and brings her meter with her to each Link To Wellness appointment.  Objective:   Review of Systems  Constitutional: Negative.     Physical Exam  Constitutional: She is oriented to person, place, and time. She appears well-developed and well-nourished.  Respiratory: Effort normal.  Neurological: She is alert and oriented to person, place, and time.  Skin: Skin is warm and dry.  Psychiatric: She has a normal mood and affect. Her behavior is normal. Judgment and thought content normal.   Filed Vitals:   08/03/15 1619  BP: 130/80   . Current Medications:   Current Outpatient Prescriptions  Medication Sig Dispense Refill  . atorvastatin (LIPITOR) 80 MG tablet Take 80 mg by mouth daily.    . Cholecalciferol (VITAMIN D PO) Take 1 tablet by mouth daily.    . citalopram (CELEXA) 40 MG tablet Take 40 mg by  mouth daily.      Marland Kitchen docusate sodium (COLACE) 100 MG capsule Take 100 mg by mouth 2 (two) times daily.    . metFORMIN (GLUCOPHAGE) 500 MG tablet Take 1,000 mg by mouth 2 (two) times daily.     . vitamin B-12 (CYANOCOBALAMIN) 1000 MCG tablet Take 1,000 mcg by mouth daily.      Marland Kitchen omeprazole (PRILOSEC) 20 MG capsule Take 20 mg by mouth daily. Reported on 08/03/2015     No current facility-administered medications for this visit.    Functional Status:   In your present state of health, do you have any difficulty performing the following activities: 08/03/2015 03/23/2015  Hearing? N N  Vision? N N  Difficulty concentrating or making decisions? N N  Walking or climbing stairs? N N  Dressing or bathing? N N  Doing errands, shopping? N N    Fall/Depression Screening:    PHQ 2/9 Scores 11/15/2014  PHQ - 2 Score 0    Assessment:   [employer] employee and Link To Wellness member with Type II DM and hyperlipidemia.   Plan:  [facility] CM Care Plan Problem One        Most Recent Value   Care Plan Problem One  Type II DM currently meeting Hgb A1C target of <7.0% per patient report, hyperlipidemia - on statin therapy but patient doesn't know lipid profile results   Role Documenting the Problem One  Care Management [place] for Problem  One  Active   THN Long Term Goal (31-90 days)  Ongoing good control of Type II DM as evidenced by Hgb A1C meeting target of <7.0% and normal lipid profile at next check   [facility] Long Term Goal Start Date  08/03/15   Interventions for Problem One Long Term Goal   reviewed patient medications, reviewed DM medication of Metformin including the mechanism of action, common side effects, dosages and dosing schedule, reinforced importance of taking all medications as prescribed,  reviewed information on the three primary macronutrients (CHO, protein, fat) and their effect on glucose levels, discussed effects of physical activity on glucose levels and long-term glucose  control by improving insulin sensitivity and assisting with weight management and cardiovascular health,  congratulated Loralai on increasing her walking time from 20 to 30 minutes,  reviewed patient's meter history and CBG log, reviewed purpose of blood glucose monitoring and interpretation, discussed blood sugars that were elevated above target and problem solved to identify reasons for the elevations and strategies to prevent future high blood sugars, reviewed recommended targets for pre and post prandial, requested she get latest lab results from Dr. Karie Kirks so that results can be reviewed at her next Link To Wellness visit, arranged for next Link To Wellness visit in May 2017     RNCM to fax today's office visit note to Dr. Karie Kirks and request latest lab results. RNCM will meet quarterly and as needed with patient per Link To Wellness program guidelines to assist with Type II DM and hyperlipidemia self-management and assess patient's progress toward mutually set goals. Barrington Ellison RN,CCM,CDE [place] Management Coordinator Link To Wellness Office Phone [phone] Office Fax [phone]

## 2015-08-05 ENCOUNTER — Other Ambulatory Visit: Payer: Self-pay | Admitting: *Deleted

## 2015-08-05 NOTE — Patient Outreach (Signed)
Faxed request to Dr, Vickey Sages office for most recent office visit note and result of any labs. Barrington Ellison RN,CCM,CDE [place] Management Coordinator Link To Wellness Office Phone [phone] Office Fax [phone]

## 2015-08-19 MED FILL — METFORMIN HCL ER 500 MG TAB: 500 | 90 days supply | Qty: 360 | Fill #0

## 2015-08-22 MED FILL — CITALOPRAM HBR 40 MG TABLET: 40 | 90 days supply | Qty: 90 | Fill #2

## 2015-08-22 MED FILL — ATORVASTATIN 80 MG TABLET: 80 | 90 days supply | Qty: 90 | Fill #2

## 2015-08-23 DIAGNOSIS — G4733 Obstructive sleep apnea (adult) (pediatric): Secondary | ICD-10-CM | POA: Diagnosis not present

## 2015-10-03 DIAGNOSIS — K210 Gastro-esophageal reflux disease with esophagitis: Secondary | ICD-10-CM | POA: Diagnosis not present

## 2015-10-03 DIAGNOSIS — F605 Obsessive-compulsive personality disorder: Secondary | ICD-10-CM | POA: Diagnosis not present

## 2015-10-03 DIAGNOSIS — E1165 Type 2 diabetes mellitus with hyperglycemia: Secondary | ICD-10-CM | POA: Diagnosis not present

## 2015-10-03 MED FILL — clomiPRAMINE HCL 50 MG CAPS: 50 | 30 days supply | Qty: 60 | Fill #0

## 2015-10-05 DIAGNOSIS — K210 Gastro-esophageal reflux disease with esophagitis: Secondary | ICD-10-CM | POA: Diagnosis not present

## 2015-10-05 DIAGNOSIS — E1165 Type 2 diabetes mellitus with hyperglycemia: Secondary | ICD-10-CM | POA: Diagnosis not present

## 2015-10-05 DIAGNOSIS — F605 Obsessive-compulsive personality disorder: Secondary | ICD-10-CM | POA: Diagnosis not present

## 2015-11-17 MED FILL — METFORMIN HCL ER 500 MG TAB: 500 | 90 days supply | Qty: 360 | Fill #1

## 2015-11-22 MED FILL — CITALOPRAM HBR 40 MG TABLET: 40 | 90 days supply | Qty: 90 | Fill #3

## 2015-11-22 MED FILL — ATORVASTATIN 80 MG TABLET: 80 | 90 days supply | Qty: 90 | Fill #3

## 2015-11-23 ENCOUNTER — Other Ambulatory Visit: Payer: Self-pay | Admitting: *Deleted

## 2015-11-23 ENCOUNTER — Encounter: Payer: Self-pay | Admitting: *Deleted

## 2015-11-23 NOTE — Patient Outreach (Signed)
[facility]) Care Management   11/23/2015  [name] 06/26/1955 [name]  [name] is an 60 y.o. female who presents to the [place] Management office for routine Link To Wellness follow up for self management assistance with Type II DM, hyperlipidemia and obesity..  Subjective:  [name] says she has gained weight because she is a stress eater and she continues to worry about her brother who was diagnosed with a rare bone marrow cancer. She says she was tested to see if she was a Orthoptist for marrow donation but she was not.  She says her stepfather is not revealing if his biopsy showed prostate cancer. She says her husband is doing OK but he has chronic health issues and is legally blind and is hearing impaired. Henchy says she has not seen Dr Karie Kirks since her last Link To Wellness visit in February. She says she saw him in mid February and that her Hgb A1C was 7.6% she thinks.  She says she has started walking again for 30 minutes in the hospital on her work days prior to starting her shift.  She continues to go to [employer] [employer] every Sunday evening. She consistently checks her blood sugar every evening after supper and brings her meter with her to each Link To Wellness appointment.  Objective:   Review of Systems  Constitutional: Negative.     Physical Exam  Constitutional: She is oriented to person, place, and time. She appears well-developed and well-nourished.  Respiratory: Effort normal.  Neurological: She is alert and oriented to person, place, and time.  Skin: Skin is warm and dry.  Psychiatric: She has a normal mood and affect. Her behavior is normal. Judgment and thought content normal.   Filed Weights   11/23/15 1622  Weight: 300 lb 12 oz (136.419 kg)   Filed Vitals:   11/23/15 1622  BP: 108/70    Encounter Medications:   Outpatient Encounter Prescriptions as of 11/23/2015  Medication  Sig Note  . atorvastatin (LIPITOR) 80 MG tablet Take 80 mg by mouth daily. 03/23/2015: Takes in the morning  . Cholecalciferol (VITAMIN D PO) Take 1 tablet by mouth daily.   . citalopram (CELEXA) 40 MG tablet Take 40 mg by mouth daily.     Marland Kitchen docusate sodium (COLACE) 100 MG capsule Take 100 mg by mouth 2 (two) times daily.   . metFORMIN (GLUCOPHAGE) 500 MG tablet Take 1,000 mg by mouth 2 (two) times daily.    . vitamin B-12 (CYANOCOBALAMIN) 1000 MCG tablet Take 1,000 mcg by mouth daily.      No facility-administered encounter medications on file as of 11/23/2015.    Functional Status:   In your present state of health, do you have any difficulty performing the following activities: 08/03/2015 03/23/2015  Hearing? N N  Vision? N N  Difficulty concentrating or making decisions? N N  Walking or climbing stairs? N N  Dressing or bathing? N N  Doing errands, shopping? N N    Fall/Depression Screening:    PHQ 2/9 Scores 11/23/2015 11/15/2014  PHQ - 2 Score 1 0  PHQ- 9 Score 5 -    Assessment:   [employer] employee and Link To Wellness member with Type II DM and hyperlipidemia, Hgb A1C no longer meeting target of <7.0% per patient report. Have not been able to get lab results from MD office despite faxed request.  Plan:  [facility] CM Care Plan Problem One  Most Recent Value   Care Plan Problem One  Type II DM currently not meeting Hgb A1C target of <7.0% per patient report, hyperlipidemia - on statin therapy but patient doesn't know lipid profile results and unable to get lab results from MD office, morbid obesity with increased weight and current BMI= 45.73   Role Documenting the Problem One  Care Management [place] for Problem One  Active   THN Long Term Goal (31-90 days)  Improved control of Type II DM as evidenced by Hgb A1C meeting target of <7.0% and normal lipid profile at next check, evidence of weight loss or no weight gain at next Link To Wellness visit   [place]  Term Goal Start Date  11/23/15   Interventions for Problem One Long Term Goal  reviewed patient medications, reviewed DM medication of Metformin including the mechanism of action, common side effects, dosages and dosing schedule, reinforced importance of taking all medications as prescribed,  reviewed information on the three primary macronutrients (CHO, protein, fat) and their effect on glucose levels, discussed effects of physical activity on glucose levels and long-term glucose control by improving insulin sensitivity and assisting with weight management and cardiovascular health,  congratulated Iselle on resuming her walking routine for 30 minutes in the hospital prior to starting her shift,  reviewed patient's meter history and 7,14 and 30 day averages, reviewed purpose of blood glucose monitoring and interpretation, discussed blood sugars that were elevated above target and problem solved with patient to identify reasons for the elevations and strategies to prevent future high blood sugars, reviewed recommended targets for pre and post prandial, again requested she get latest lab results from Dr. Karie Kirks so that results can be reviewed at her next Link To Wellness visit, arranged for next Link To Wellness visit in August 2017     RNCM to fax today's office visit note to Dr. Karie Kirks. RNCM will meet quarterly and as needed with patient per Link To Wellness program guidelines to assist with Type II DM, hyperlipidemia and obesity self-management and assess patient's progress toward mutually set goals. Barrington Ellison RN,CCM,CDE [place] Management Coordinator Link To Wellness Office Phone [phone] Office Fax [phone]

## 2016-01-23 DIAGNOSIS — F605 Obsessive-compulsive personality disorder: Secondary | ICD-10-CM | POA: Diagnosis not present

## 2016-01-23 DIAGNOSIS — G4733 Obstructive sleep apnea (adult) (pediatric): Secondary | ICD-10-CM | POA: Diagnosis not present

## 2016-01-23 DIAGNOSIS — E1165 Type 2 diabetes mellitus with hyperglycemia: Secondary | ICD-10-CM | POA: Diagnosis not present

## 2016-02-02 DIAGNOSIS — E782 Mixed hyperlipidemia: Secondary | ICD-10-CM | POA: Diagnosis not present

## 2016-02-02 DIAGNOSIS — E1165 Type 2 diabetes mellitus with hyperglycemia: Secondary | ICD-10-CM | POA: Diagnosis not present

## 2016-02-15 ENCOUNTER — Other Ambulatory Visit: Payer: Self-pay | Admitting: *Deleted

## 2016-02-15 NOTE — Patient Outreach (Signed)
[facility]) Care Management   02/15/2016  MARYANN FERRIGNO 31-Oct-1955 [id number]  BELEN WESTMEYER is an 60 y.o. female who presents to the [place] Management office for routine Link To Wellness follow up for self management assistance with Type II DM, hyperlipidemia and obesity.  Subjective: Kannon has no complaints. She says her brother is doing well post bone marrow transplant and this has significantly reduced the stress in her life.  She says her blood sugar averages are running higher than usual and she attributes this to stress. She says now that her brother is doing well, she expects to see her blood sugars improve.  He continues to check her blood sugars two hours after dinner everyday. Objective:   Review of Systems  Constitutional: Negative.     Physical Exam  Constitutional: She is oriented to person, place, and time. She appears well-developed and well-nourished.  Respiratory: Effort normal.  Neurological: She is alert and oriented to person, place, and time.  Skin: Skin is warm and dry.  Psychiatric: She has a normal mood and affect. Her behavior is normal. Judgment and thought content normal.   Filed Weights   02/15/16 1609  Weight: 300 lb (136.1 kg)   Vitals:   02/15/16 1609  BP: 122/75    Encounter Medications:   Outpatient Encounter Prescriptions as of 02/15/2016  Medication Sig Note  . atorvastatin (LIPITOR) 80 MG tablet Take 80 mg by mouth daily. 03/23/2015: Takes in the morning  . Cholecalciferol (VITAMIN D PO) Take 1 tablet by mouth daily.   . citalopram (CELEXA) 40 MG tablet Take 40 mg by mouth daily.     Marland Kitchen docusate sodium (COLACE) 100 MG capsule Take 100 mg by mouth 2 (two) times daily.   . metFORMIN (GLUCOPHAGE) 500 MG tablet Take 1,000 mg by mouth 2 (two) times daily.    . vitamin B-12 (CYANOCOBALAMIN) 1000 MCG tablet Take 1,000 mcg by mouth daily.     Marland Kitchen omeprazole (PRILOSEC) 20 MG capsule Take 20 mg by  mouth daily. Reported on 11/23/2015 02/15/2016: Weaned off  easily in January 2017   No facility-administered encounter medications on file as of 02/15/2016.     Functional Status:   In your present state of health, do you have any difficulty performing the following activities: 08/03/2015 03/23/2015  Hearing? N N  Vision? N N  Difficulty concentrating or making decisions? N N  Walking or climbing stairs? N N  Dressing or bathing? N N  Doing errands, shopping? N N  Some recent data might be hidden    Fall/Depression Screening:    PHQ 2/9 Scores 11/23/2015 11/15/2014  PHQ - 2 Score 1 0  PHQ- 9 Score 5 -    Assessment:  [employer] employee and Link To Wellness member with Type II DM and hyperlipidemia, not meeting Hgb A1C target of <7.0% per patient report. Have not been able to get lab results from MD office despite faxed request.  Plan:  [facility] CM Care Plan Problem One   Flowsheet Row Most Recent Value  Care Plan Problem One Type II DM currently not meeting Hgb A1C target of <7.0% per patient report, hyperlipidemia - on statin therapy but patient doesn't know lipid profile results even though she was requested to bring them to her Link To Wellness appointment, morbid obesity with increased weight and current body mass index=  >45  Role Documenting the Problem One  Care Management [place] for  Problem One  Active  THN Long Term Goal (31-90 days)  Improved control of Type II DM as evidenced by Hgb A1C meeting target of <7.0% and normal lipid profile at next check, evidence of weight loss or no weight gain at next Link To Wellness visit  [place] Term Goal Start Date  02/15/16  Interventions for Problem One Long Term Goal reviewed patient medications, reviewed DM medication of Metformin including the mechanism of action, common side effects, dosages and dosing schedule, reinforced importance of taking all medications as prescribed,  reviewed information on the three primary  macronutrients (CHO, protein, fat) and their effect on glucose levels, discussed effects of physical activity on glucose levels and long-term glucose control by improving insulin sensitivity and assisting with weight management and cardiovascular health,  congratulated Marle on resuming her walking routine for 30 minutes in the hospital prior to starting her shift and for continuing to participate in [employer],  reviewed patient's meter history and 7,14 and 30 day averages, reviewed purpose of blood glucose monitoring and interpretation, discussed blood sugars that were elevated above target and problem solved with patient to identify reasons for the elevations and strategies to prevent future high blood sugars, reviewed recommended targets for pre and post prandial, again requested she get latest lab results from Dr. Karie Kirks so that results can be reviewed at her next Link To Wellness visit, will arrange for next Link To Wellness visit in November 2017     RNCM to fax today's office visit note to Dr. Karie Kirks. RNCM will meet quarterly and as needed with patient per Link To Wellness program guidelines to assist with Type II DM, hyperlipidemia and obesity self-management and assess patient's progress toward mutually set goals.   Barrington Ellison RN,CCM,CDE [place] Management Coordinator Link To Wellness Office Phone [phone] Office Fax [phone]

## 2016-02-20 MED FILL — TRUE METRIX GLUCOSE TEST ST: 90 days supply | Qty: 200 | Fill #0

## 2016-02-20 MED FILL — CITALOPRAM HBR 40 MG TABLET: 40 | 90 days supply | Qty: 90 | Fill #0

## 2016-02-20 MED FILL — ATORVASTATIN 80 MG TABLET: 80 | 90 days supply | Qty: 90 | Fill #0

## 2016-02-20 MED FILL — METFORMIN HCL ER 500 MG TAB: 500 | 90 days supply | Qty: 360 | Fill #2

## 2016-02-20 MED FILL — TRUEplus LANCETS 30G MISC: 90 days supply | Qty: 200 | Fill #0

## 2016-02-22 DIAGNOSIS — G4733 Obstructive sleep apnea (adult) (pediatric): Secondary | ICD-10-CM | POA: Diagnosis not present

## 2016-05-02 DIAGNOSIS — R221 Localized swelling, mass and lump, neck: Secondary | ICD-10-CM | POA: Diagnosis not present

## 2016-05-03 ENCOUNTER — Ambulatory Visit (INDEPENDENT_AMBULATORY_CARE_PROVIDER_SITE_OTHER): Payer: 59 | Admitting: Otolaryngology

## 2016-05-03 DIAGNOSIS — K1121 Acute sialoadenitis: Secondary | ICD-10-CM | POA: Diagnosis not present

## 2016-05-21 MED FILL — ATORVASTATIN 80 MG TABLET: 80 | 90 days supply | Qty: 90 | Fill #1

## 2016-05-21 MED FILL — CITALOPRAM HBR 40 MG TABLET: 40 | 90 days supply | Qty: 90 | Fill #1

## 2016-05-23 MED FILL — METFORMIN HCL ER 500 MG TAB: 500 | 90 days supply | Qty: 360 | Fill #3

## 2016-05-28 DIAGNOSIS — E1165 Type 2 diabetes mellitus with hyperglycemia: Secondary | ICD-10-CM | POA: Diagnosis not present

## 2016-05-28 DIAGNOSIS — F605 Obsessive-compulsive personality disorder: Secondary | ICD-10-CM | POA: Diagnosis not present

## 2016-05-28 DIAGNOSIS — E782 Mixed hyperlipidemia: Secondary | ICD-10-CM | POA: Diagnosis not present

## 2016-05-28 MED FILL — clomiPRAMINE HCL 25 MG CAPS: 25 | 30 days supply | Qty: 30 | Fill #0

## 2016-06-06 ENCOUNTER — Encounter: Payer: Self-pay | Admitting: *Deleted

## 2016-06-06 ENCOUNTER — Other Ambulatory Visit: Payer: Self-pay | Admitting: *Deleted

## 2016-06-06 NOTE — Patient Outreach (Addendum)
[facility]) Care Management   06/06/2016  [name] 08-31-1955 [id number]  [name] is an 60 y.o. female who presents to the [place] Management office for routine Link To Wellness follow up for self management assistance with Type II DM, hyperlipidemia and obesity.  Subjective: [name] is voicing concern that her blood sugar averages are running even higher than at last visit. She attributes this to overeating the "wrong" foods.  She also thinks the stress of caring for her husband after knee replacement surgery is also contributing to her high blood sugars. She continues to check her blood sugars two hours after dinner everyday. She says she saw Dr. Karie Kirks on 12/4 and then had labs drawn today so she does not know the results of the labs. She also says she was put on a medicine for obsessive compulsive disorder but she doesn't know the name of the medicine and she wonders if the medication may be elevating her blood sugar. She says her brother is doing well after his bone marrow transplant.     Objective:   Review of Systems  Constitutional: Negative.     Physical Exam  Constitutional: She is oriented to person, place, and time. She appears well-developed and well-nourished.  Respiratory: Effort normal.  Neurological: She is alert and oriented to person, place, and time.  Skin: Skin is warm and dry.  Psychiatric: She has a normal mood and affect. Her behavior is normal. Judgment and thought content normal.    Filed Weights   06/06/16 1630  Weight: 297 lb (134.7 kg)   Vitals:   06/06/16 1630  BP: 130/70    Encounter Medications:   Outpatient Encounter Prescriptions as of 06/06/2016  Medication Sig Note  . atorvastatin (LIPITOR) 80 MG tablet Take 80 mg by mouth daily. 03/23/2015: Takes in the morning  . Cholecalciferol (VITAMIN D PO) Take 1 tablet by mouth daily.   . citalopram (CELEXA) 40 MG tablet Take 40 mg  by mouth daily.     Marland Kitchen docusate sodium (COLACE) 100 MG capsule Take 100 mg by mouth 2 (two) times daily.   . metFORMIN (GLUCOPHAGE) 500 MG tablet Take 1,000 mg by mouth 2 (two) times daily.    . vitamin B-12 (CYANOCOBALAMIN) 1000 MCG tablet Take 1,000 mcg by mouth daily.     Marland Kitchen omeprazole (PRILOSEC) 20 MG capsule Take 20 mg by mouth daily. Reported on 11/23/2015 02/15/2016: Weaned off  easily in January 2017   No facility-administered encounter medications on file as of 06/06/2016.     Functional Status:   In your present state of health, do you have any difficulty performing the following activities: 08/03/2015  Hearing? N  Vision? N  Difficulty concentrating or making decisions? N  Walking or climbing stairs? N  Dressing or bathing? N  Doing errands, shopping? N  Some recent data might be hidden    Fall/Depression Screening:    PHQ 2/9 Scores 11/23/2015 11/15/2014  PHQ - 2 Score 1 0  PHQ- 9 Score 5 -    Assessment:  [name] employee and Link To Wellness member with Type II DM and hyperlipidemia, not meeting Hgb A1C target of <7.0% per patient report. Hgb A1C= 7.9% per lab worked received from MD office, lipi panel done 06/07/16 shows total cholesterol of 166, triglycerides= 151, HDL= 47, LDL= 94.  Plan:  [facility] CM Care Plan Problem One   Flowsheet Row Most Recent Value  Care Plan Problem One  Type II DM currently not meeting Hgb A1C target of <7.0% per patient report, hyperlipidemia - on statin therapy with slightly elevated triglycerides at 151, low HDL= 47and morbid obesity with increased weight and current body mass index=  >45  Role Documenting the Problem One  Care Management Coordinator  Care Plan for Problem One  Active  THN Long Term Goal (31-90 days) Improved control of Type II DM as evidenced by Hgb A1C meeting target of <7.0% and normal lipid profile at next check, evidence of weight loss or no weight gain at next Link To Wellness visit  [name] Term Goal Start Date  06/06/16   Interventions for Problem One Long Term Goal reviewed patient medications, reviewed DM medication of Metformin including the mechanism of action, common side effects, dosages and dosing schedule, reinforced importance of taking all medications as prescribed,  discussed secondary failure of medications, chronic progressive nature of Type II Dm that usually requires more than one medication to correct the pathophysiologic defects in Type II DM, reviewed information on the three primary macronutrients (CHO, protein, fat) and their effect on glucose levels, discussed effects of physical activity on glucose levels and long-term glucose control by improving insulin sensitivity and assisting with weight management and cardiovascular health,  congratulated [name] on resuming her walking routine for 30 minutes in the hospital prior to starting her shift and for continuing to participate in Taking Off Pounds [employer],  reviewed patient's meter history and 7,14 and 30 day averages, reviewed purpose of blood glucose monitoring and interpretation, discussed blood sugars that were elevated above target and problem solved with patient to identify reasons for the elevations and strategies to prevent future high blood sugars, reviewed recommended targets for pre and post prandial, sent fax to  Dr. Vickey Sages office requesting results of latest labs and the medicine she was prescribed for obsessive compulsive disorder, arranged for next Link To Wellness visit in March 2018     RNCM to fax today's office visit note to Dr. Karie Kirks. RNCM will meet quarterly and as needed with patient per Link To Wellness program guidelines to assist with Type II DM, hyperlipidemia and obesity self-management and assess patient's progress toward mutually set goals.   Barrington Ellison RN,CCM,CDE [place] Management Coordinator Link To Wellness Office Phone [phone] Office Fax [phone]

## 2016-06-07 DIAGNOSIS — F605 Obsessive-compulsive personality disorder: Secondary | ICD-10-CM | POA: Diagnosis not present

## 2016-06-07 DIAGNOSIS — F4321 Adjustment disorder with depressed mood: Secondary | ICD-10-CM | POA: Diagnosis not present

## 2016-06-07 DIAGNOSIS — J301 Allergic rhinitis due to pollen: Secondary | ICD-10-CM | POA: Diagnosis not present

## 2016-06-07 DIAGNOSIS — E1165 Type 2 diabetes mellitus with hyperglycemia: Secondary | ICD-10-CM | POA: Diagnosis not present

## 2016-06-07 DIAGNOSIS — K210 Gastro-esophageal reflux disease with esophagitis: Secondary | ICD-10-CM | POA: Diagnosis not present

## 2016-06-07 DIAGNOSIS — E1142 Type 2 diabetes mellitus with diabetic polyneuropathy: Secondary | ICD-10-CM | POA: Diagnosis not present

## 2016-06-07 DIAGNOSIS — E782 Mixed hyperlipidemia: Secondary | ICD-10-CM | POA: Diagnosis not present

## 2016-06-25 MED FILL — clomiPRAMINE HCL 25 MG CAPS: 25 | 30 days supply | Qty: 30 | Fill #1

## 2016-07-24 MED FILL — clomiPRAMINE HCL 25 MG CAPS: 25 | 30 days supply | Qty: 30 | Fill #2

## 2016-08-22 MED FILL — CITALOPRAM HBR 40 MG TABLET: 40 | 90 days supply | Qty: 90 | Fill #2

## 2016-08-22 MED FILL — ATORVASTATIN 80 MG TABLET: 80 | 90 days supply | Qty: 90 | Fill #2

## 2016-08-22 MED FILL — clomiPRAMINE HCL 25 MG CAPS: 25 | 30 days supply | Qty: 30 | Fill #3

## 2016-08-22 MED FILL — METFORMIN HCL ER 500 MG TAB: 500 | 90 days supply | Qty: 360 | Fill #0

## 2016-08-30 DIAGNOSIS — G4733 Obstructive sleep apnea (adult) (pediatric): Secondary | ICD-10-CM | POA: Diagnosis not present

## 2016-09-12 ENCOUNTER — Ambulatory Visit: Payer: Self-pay | Admitting: *Deleted

## 2016-09-24 MED FILL — clomiPRAMINE HCL 25 MG CAPS: 25 | 30 days supply | Qty: 30 | Fill #4

## 2016-09-25 ENCOUNTER — Other Ambulatory Visit: Payer: Self-pay | Admitting: *Deleted

## 2016-09-25 NOTE — Patient Outreach (Signed)
Left message on patient's home phone advising her that her next Link To Wellness appointment is Wednesday May 30th at 4:00 pm. Barrington Ellison RN,CCM,CDE [place] Management Coordinator Link To Wellness Office Phone [phone] Office Fax [phone]

## 2016-10-02 DIAGNOSIS — F605 Obsessive-compulsive personality disorder: Secondary | ICD-10-CM | POA: Diagnosis not present

## 2016-10-02 DIAGNOSIS — F4321 Adjustment disorder with depressed mood: Secondary | ICD-10-CM | POA: Diagnosis not present

## 2016-10-02 DIAGNOSIS — E1165 Type 2 diabetes mellitus with hyperglycemia: Secondary | ICD-10-CM | POA: Diagnosis not present

## 2016-10-02 DIAGNOSIS — H9311 Tinnitus, right ear: Secondary | ICD-10-CM | POA: Diagnosis not present

## 2016-10-02 MED FILL — GLIMEPIRIDE 1 MG TABLET: 1 | 90 days supply | Qty: 90 | Fill #0

## 2016-10-03 MED FILL — FLUoxetine HCL 20 MG CAPS: 20 | 30 days supply | Qty: 30 | Fill #0

## 2016-10-05 MED FILL — FREESTYLE LITE TEST STRIP: 90 days supply | Qty: 200 | Fill #0

## 2016-10-05 MED FILL — FREESTYLE LANCETS: 90 days supply | Qty: 200 | Fill #0

## 2016-10-05 MED FILL — FREESTYLE LITE METER: 30 days supply | Qty: 1 | Fill #0

## 2016-10-11 DIAGNOSIS — E1165 Type 2 diabetes mellitus with hyperglycemia: Secondary | ICD-10-CM | POA: Diagnosis not present

## 2016-10-30 MED FILL — FLUoxetine HCL 20 MG CAPS: 20 | 30 days supply | Qty: 30 | Fill #1

## 2016-11-20 MED FILL — ATORVASTATIN 80 MG TABLET: 80 | 90 days supply | Qty: 90 | Fill #3

## 2016-11-21 ENCOUNTER — Other Ambulatory Visit: Payer: Self-pay | Admitting: *Deleted

## 2016-11-21 ENCOUNTER — Encounter: Payer: Self-pay | Admitting: *Deleted

## 2016-11-22 NOTE — Patient Outreach (Signed)
[facility]) Care Management   11/21/2016  [name] 14-Jan-1956 [id number]  [name] is an 61 y.o. female who presents to the [facility] office for routine Link To Wellness follow up for self management assistance with Type II DM, hyperlipidemia and obesity.  Subjective: [name] states her blood sugars have improved since she started an additional diabetes medicine but she doesn't know the name or dose. She continues to check her blood sugars two hours after dinner everyday. She also says that she stopped the clomipramine due to adverse side effects - hot flashes- and was started on another medication but again she does not know the name or dose. She says her brother is doing well after his bone marrow transplant and that her husband is having arthroscopic outpatient knee surgery tomorrow.  She says she continues to attend TOPS (Taking Off Pounds Sensibly) meetings every week but has gained weight despite the support and education she receives in the meetings.  She says she hopes to retire later this year or in early 2019.      Objective:   Freestyle Lite glucometer history reviewed with [name]. Post prandial blood sugar averages as follows: 7 day average= 187, 14 day average= 185, 30 day average= 181, variance= 118-251  Review of Systems  Constitutional: Negative.     Physical Exam  Constitutional: She is oriented to person, place, and time. She appears well-developed and well-nourished.  Respiratory: Effort normal.  Neurological: She is alert and oriented to person, place, and time.  Skin: Skin is warm and dry.  Psychiatric: She has a normal mood and affect. Her behavior is normal. Judgment and thought content normal.    Filed Weights   11/21/16 1632  Weight: 295 lb (133.8 kg)   Vitals:   11/21/16 1632  BP: 130/80    POC CBG= 112   Encounter Medications:   Outpatient Encounter Prescriptions as of  11/21/2016  Medication Sig Note  . atorvastatin (LIPITOR) 80 MG tablet Take 80 mg by mouth daily. 03/23/2015: Takes in the morning  . Cholecalciferol (VITAMIN D PO) Take 1 tablet by mouth daily. 06/26/2016: Takes 2000 international units daily  . citalopram (CELEXA) 40 MG tablet Take 40 mg by mouth daily.   11/21/2016: Takes 1/2 of tablet or 20 mg  . docusate sodium (COLACE) 100 MG capsule Take 100 mg by mouth 2 (two) times daily. 06/26/2016: Takes 2 capsules at bedtime  . metFORMIN (GLUCOPHAGE) 500 MG tablet Take 1,000 mg by mouth 2 (two) times daily.     Fluoxetine (Prozac)  20 mg by mouth daily Started on 10/02/16   Glimepiride 1 mg by mouth daily Started on 4/101/8  . vitamin B-12 (CYANOCOBALAMIN) 1000 MCG tablet Take 1,000 mcg by mouth daily.      No facility-administered encounter medications on file as of 11/21/2016.     Functional Status:   In your present state of health, do you have any difficulty performing the following activities: 11/21/2016 06/06/2016  Hearing? N N  Vision? N N  Difficulty concentrating or making decisions? N N  Walking or climbing stairs? N N  Dressing or bathing? N N  Doing errands, shopping? N N  Preparing Food and eating ? N -  Using the Toilet? N -  In the past six months, have you accidently leaked urine? N -  Do you have problems with loss of bowel control? N -  Managing your Medications? N -  Managing  your Finances? N -  Housekeeping or managing your Housekeeping? N -  Some recent data might be hidden    Fall/Depression Screening:    PHQ 2/9 Scores 11/21/2016 11/23/2015 11/15/2014  PHQ - 2 Score 0 1 0  PHQ- 9 Score - 5 -    Assessment:  [employer] employee and Link To Wellness member with Type II DM and hyperlipidemia, not meeting Hgb A1C target of <7.0% per patient report. Hgb A1C= 7.9% per lab worked received from MD office, lipid panel done 06/07/16 shows total cholesterol of 166, triglycerides= 151, HDL= 47, LDL= 94.  Plan:  [facility] CM Care Plan  Problem One   Flowsheet Row Most Recent Value  Care Plan Problem One Type II DM currently not meeting Hgb A1C target of <7.0% per patient report, hyperlipidemia - on statin therapy with slightly elevated triglycerides at 151, low HDL= 47 and morbid obesity with increased weight and current body mass index=  44.9  Role Documenting the Problem One  Care Management Coordinator  Care Plan for Problem One  Active  [facility] Long Term Goal (31-90 days) Improved control of Type II DM as evidenced by Hgb A1C meeting target of <7.0% and normal lipid profile at next check, evidence of weight loss or no weight gain at next Link To Wellness visit  [place] Term Goal Start Date  11/21/16  Interventions for Problem One Long Term Goal reviewed patient medications,  reinforced importance of taking all medications as prescribed,  discussed secondary failure of medications and the chronic progressive nature of Type II DM that usually requires more than one medication to correct the pathophysiologic defects in Type II DM, verified with pharmacy that [name] is now taking Glimepiride and discussed mechanism of action, onset and duration and common side effects such as weight gain and hypoglycemia if not taken with a meal or if eating patterns have long lengths of time between eating,  reviewed information on the three primary macronutrients (CHO, protein, fat) and their effect on glucose levels, discussed effects of physical activity on glucose levels and long-term glucose control by improving insulin sensitivity and assisting with weight management and cardiovascular health,  congratulated [name] on resuming her walking routine for 30 minutes in the hospital prior to starting her shift and for continuing to participate in Taking Off Pounds Sensibly,  reviewed patient's meter history and 7,14 and 30 day averages, set the date and time correctly on her glucometer, discussed blood sugars that were elevated above target and problem solved  with patient to identify reasons for the elevations and strategies to prevent future high blood sugars, reviewed recommended targets for pre and post prandial, sent fax to  Dr. Vickey Sages office requesting results of latest labs and the medicine she was prescribed for obsessive compulsive disorder, arranged for next Link To Wellness visit in August 2018     RNCM to fax today's office visit note to Dr. Karie Kirks. RNCM will meet quarterly and as needed with patient per Link To Wellness program guidelines to assist with Type II DM, hyperlipidemia and obesity self-management and assess patient's progress toward mutually set goals.   Barrington Ellison RN,CCM,CDE [place] Management Coordinator Link To Wellness Office Phone [phone] Office Fax [phone]

## 2016-11-26 MED FILL — METFORMIN HCL ER 500 MG TAB: 500 | 90 days supply | Qty: 360 | Fill #1

## 2016-11-28 MED FILL — FLUoxetine HCL 20 MG CAPS: 20 | 30 days supply | Qty: 30 | Fill #2

## 2016-12-17 MED FILL — CITALOPRAM HBR 40 MG TABLET: 40 | 90 days supply | Qty: 90 | Fill #3

## 2016-12-31 MED FILL — GLIMEPIRIDE 1 MG TABLET: 1 | 90 days supply | Qty: 90 | Fill #1

## 2017-01-22 DIAGNOSIS — E1165 Type 2 diabetes mellitus with hyperglycemia: Secondary | ICD-10-CM | POA: Diagnosis not present

## 2017-01-22 DIAGNOSIS — E782 Mixed hyperlipidemia: Secondary | ICD-10-CM | POA: Diagnosis not present

## 2017-01-22 DIAGNOSIS — F605 Obsessive-compulsive personality disorder: Secondary | ICD-10-CM | POA: Diagnosis not present

## 2017-01-22 DIAGNOSIS — F4321 Adjustment disorder with depressed mood: Secondary | ICD-10-CM | POA: Diagnosis not present

## 2017-01-31 DIAGNOSIS — F4321 Adjustment disorder with depressed mood: Secondary | ICD-10-CM | POA: Diagnosis not present

## 2017-01-31 DIAGNOSIS — E1165 Type 2 diabetes mellitus with hyperglycemia: Secondary | ICD-10-CM | POA: Diagnosis not present

## 2017-01-31 DIAGNOSIS — E782 Mixed hyperlipidemia: Secondary | ICD-10-CM | POA: Diagnosis not present

## 2017-02-13 ENCOUNTER — Other Ambulatory Visit: Payer: Self-pay | Admitting: *Deleted

## 2017-02-13 NOTE — Patient Outreach (Addendum)
Chisom stopped by the [facility] CM office at [facility] to advise this RNCM that she will be retiring from [employer] at the end of September. She says she will not [employer] and that her health insurance under the [facility] plan will end on September 30th, 2018.  Daizy Outen to get her medications and DM testing supplies filled one last time before she leaves the [facility] plan. Ayat expressed her appreciation for the help this RNCM has provided to her over the last 4 1/2 years in helping her with her chronic disease self management skills.  Will close case on 03/24/17. Barrington Ellison RN,CCM,CDE [place] Management Coordinator Link To Wellness and [employer] [phone] Office Fax [phone]

## 2017-02-26 MED FILL — FREESTYLE LANCETS: 90 days supply | Qty: 200 | Fill #1

## 2017-02-26 MED FILL — FREESTYLE LITE TEST STRIP: 90 days supply | Qty: 200 | Fill #1

## 2017-02-28 MED FILL — METFORMIN HCL ER 500 MG TAB: 500 | 90 days supply | Qty: 360 | Fill #2

## 2017-03-06 DIAGNOSIS — G4733 Obstructive sleep apnea (adult) (pediatric): Secondary | ICD-10-CM | POA: Diagnosis not present

## 2017-03-25 ENCOUNTER — Other Ambulatory Visit: Payer: Self-pay | Admitting: *Deleted

## 2017-03-25 NOTE — Patient Outreach (Signed)
Aunisty is no longer eligible for the Link To Wellness program and the associated program pharmacy benefits as she has retired from [employer] effective 03/24/17. Will close case and ask Arville Care, CM assistant to notify [facility] and Verita Lamb.  Barrington Ellison RN,CCM,CDE [place] Management Coordinator Link To Wellness and [employer] [phone] Office Fax [phone]

## 2017-05-09 ENCOUNTER — Encounter: Payer: Self-pay | Admitting: Internal Medicine

## 2019-09-08 ENCOUNTER — Other Ambulatory Visit (HOSPITAL_COMMUNITY): Payer: Self-pay | Admitting: Family Medicine

## 2019-09-08 DIAGNOSIS — Z1231 Encounter for screening mammogram for malignant neoplasm of breast: Secondary | ICD-10-CM

## 2019-09-16 ENCOUNTER — Ambulatory Visit (HOSPITAL_COMMUNITY)
Admission: RE | Admit: 2019-09-16 | Discharge: 2019-09-16 | Disposition: A | Discharge status: Home / Self Care | Payer: BC Managed Care – PPO | Source: Ambulatory Visit | Attending: Family Medicine | Admitting: Family Medicine

## 2019-09-16 ENCOUNTER — Other Ambulatory Visit: Payer: Self-pay

## 2019-09-16 DIAGNOSIS — Z1231 Encounter for screening mammogram for malignant neoplasm of breast: Secondary | ICD-10-CM | POA: Diagnosis not present

## 2019-09-28 ENCOUNTER — Telehealth: Payer: Self-pay | Admitting: Internal Medicine

## 2019-09-28 NOTE — Telephone Encounter (Signed)
Pt's last colonoscopy was in 2015 and RMR recommended to have another in 3 years. Patient is past due. Does she need OV or NV? [phone]

## 2019-09-28 NOTE — Telephone Encounter (Signed)
There are no updated meds in epic. We can do a nurse visit and if needed we can bring her in for an office visit at a later time.

## 2019-09-28 NOTE — Telephone Encounter (Signed)
Pt is aware of NV on 4/22 at 3pm

## 2019-10-15 ENCOUNTER — Other Ambulatory Visit: Payer: Self-pay

## 2019-10-15 ENCOUNTER — Ambulatory Visit (INDEPENDENT_AMBULATORY_CARE_PROVIDER_SITE_OTHER): Payer: Self-pay | Admitting: *Deleted

## 2019-10-15 DIAGNOSIS — Z86010 Personal history of colonic polyps: Secondary | ICD-10-CM

## 2019-10-15 NOTE — Patient Instructions (Addendum)
[place]   Please notify us immediately if you are diabetic, take iron supplements, or if you are on coumadin or any blood thinners.   Patient Name: [name]  Date of procedure: 01/08/2020 Time to register at [place] Stay:  6:30 am  Provider: Dr. Gala Romney   Purchase: MIRALAX 238 gram bottle, 1 FLEET ENEMA, 1 box of DULCOLAX (All over the counter medications)    01/06/2020- 2 Days prior to procedure: START CLEAR LIQUID DIET AFTER YOUR LUNCH MEAL--NO SOLID FOODS!   01/07/2020- 1 Day prior to procedure:   CLEAR LIQUIDS ALL DAY--NO SOLID FOODS!   Diabetic medication adjustments for today:    At 10:00 AM, take 2 DULCOLAX 76m tablets   At 12:00 PM, Mix 5 teaspoons of Miralax in any 4-6 ounces of CLEAR LIQUIDS (Gatorade) every hour for 5 hours until passing clear, watery stools. Be sure to drink 4 ounces of clear liquid 30 minutes after each dose of Miralax.   At 3:00 PM, take 2 Dulcolax 558mtablets   If stools are not clear & watery by 6:00 PM, take 5 teaspoons of Miralax every 30 minutes until stools are clear (no color)   You must have a complete prep to ensure the most effective cleaning.   CONTINUE CLEAR LIQUIDS ONLY UNTIL MIDNIGHT. Make a conscious effort to drink as much as you can before, during & after the preparation.    NOTHING TO EAT OR DRINK AFTER MIDNIGHT except for your heart, blood pressure & breathing medications. You may take them with a sip of clear liquids.    01/08/2020-- Day of Procedure  Give yourself one Fleet enema about 1 hour prior to leaving for the hospital.   Diabetic medication adjustments for today:   You may take TYLENOL products. Please continue your regular medications unless we have instructed you otherwise.    Please note, on the day of your procedure you MUST be accompanied by an adult who is willing to assume responsibility for you at time of discharge. If you do not have such person with you, your  procedure will have to be rescheduled.                                                             Please leave ALL jewelry at home prior to coming to the hospital for your procedure.   *It is your responsibility to check with your insurance company for the benefits of coverage you have for this procedure. Unfortunately, not all insurance companies have benefits to cover all or part of these types of procedures. It is your responsibility to check your benefits, however we will be glad to assist you with any codes your insurance company may need.   Please note that most insurance companies will not cover a screening colonoscopy for people under the age of 5068For example, with some insurance companies you may have benefits for a screening colonoscopy, but if polyps are found the diagnosis will change and then you may have a deductible that will need to be met. Please make sure you check your benefits for screening colonoscopy as well as a diagnostic colonoscopy.    CLEAR LIQUIDS: (NO RED)  Jello Apple Juice White Grape Juice Water  Banana popsicles Kool-Aid Coffee(No cream or  milk)  Tea (No cream or milk) Soft drinks Broth (fat free beef/chicken/vegetable)   Clear liquids allow you to see your fingers on the other side of the glass. Be sure they are NOT RED in color, cloudy, but CLEAR.   Do Not Eat:  Dairy products of any kind Cranberry juice  Tomato or V8 Juice Orange Juice  Grapefruit Juice Red Grape Juice  Solid foods like cereal, oatmeal, yogurt, fruits, vegetables, creamed soups, eggs, bread, etc   HELPFUL HINTS TO MAKE DRINKING EASIER:  -Make sure prep is extremely COLD. Refrigerate the night before. You may also put in freezer.  -You may try mixing Crystal Light or Country Time Lemonade if you prefer. MIx in small amounts. Add more if necessary.  -Trying drinking through a straw.  -Rinse mouth with water or mouthwash  between glasses to remove aftertaste.  -Try sipping on a cold beverage/ice popsicles between glasses of prep.  -Place a piece of sugar-free hard candy in mouth between glasses.  -If you become nauseated, try consuming smaller amounts or stretch out the time between glasses. Stop for 30 minutes to an hour & slowly start back drinking.   Call our office with any questions or concerns at [phone].   Thank You

## 2019-10-15 NOTE — Progress Notes (Signed)
Gastroenterology Pre-Procedure Review  Request Date: 10/15/2019 Requesting Physician: Was due in 2018 for repeat, Last TCS 06/21/2014 done by Dr. Gala Romney, tubular adenoma  PATIENT REVIEW QUESTIONS: The patient responded to the following health history questions as indicated:    1. Diabetes Melitis: yes 2. Joint replacements in the past 12 months: no 3. Major health problems in the past 3 months: no 4. Has an artificial valve or MVP: no 5. Has a defibrillator: no 6. Has been advised in past to take antibiotics in advance of a procedure like teeth cleaning: no 7. Family history of colon cancer: no  8. Alcohol Use: no 9. Illicit drug Use: no 10. History of sleep apnea: yes, CPAP  11. History of coronary artery or other vascular stents placed within the last 12 months: no 12. History of any prior anesthesia complications: no 13. There is no height or weight on file to calculate BMI. ht: 5' 8 wt: 294 lbs    MEDICATIONS & ALLERGIES:    Patient reports the following regarding taking any blood thinners:   Plavix? no Aspirin? no Coumadin? no Brilinta? no Xarelto? no Eliquis? no Pradaxa? no Savaysa? no Effient? no  Patient confirms/reports the following medications:  Current Outpatient Medications  Medication Sig Dispense Refill  . atorvastatin (LIPITOR) 80 MG tablet Take 80 mg by mouth daily.    . Cholecalciferol (VITAMIN D PO) Take 1 tablet by mouth daily.    . citalopram (CELEXA) 40 MG tablet Take 40 mg by mouth daily.      Marland Kitchen glimepiride (AMARYL) 4 MG tablet Take 4 mg by mouth every morning. Takes 2 tablets every morning.    . metFORMIN (GLUCOPHAGE) 500 MG tablet Take 1,000 mg by mouth 2 (two) times daily.     . Pseudoephedrine-Ibuprofen 30-200 MG TABS Take by mouth as needed.    . vitamin B-12 (CYANOCOBALAMIN) 1000 MCG tablet Take 1,000 mcg by mouth daily.       No current facility-administered medications for this visit.    Patient confirms/reports the following allergies:   Allergies  Allergen Reactions  . Clomipramine Hcl Other (See Comments)    Caused hot flashes  . Sulfa Antibiotics Hives  . Sulfur Hives    No orders of the defined types were placed in this encounter.   AUTHORIZATION INFORMATION Primary Insurance: BCBS [place],  ID #: [id number]  Group #: [id number] Pre-Cert / Auth required: No, not required  SCHEDULE INFORMATION: Procedure has been scheduled as follows:  Date: 01/08/2020, Time: 7:30  Location: [facility] with Dr. Gala Romney  This Gastroenterology Pre-Precedure Review Form is being routed to the following provider(s): Neil Crouch, PA

## 2019-10-16 NOTE — Progress Notes (Signed)
Ok to schedule for conscious sedation. Same medications, same weight as before and she did well with conscious sedation.   Day of prep: amaryl 1/2 dose, metformin 1/2 dose.  Am of tcs: hold above.

## 2019-10-19 ENCOUNTER — Encounter: Payer: Self-pay | Admitting: *Deleted

## 2019-11-02 ENCOUNTER — Telehealth: Payer: Self-pay | Admitting: *Deleted

## 2019-11-02 NOTE — Telephone Encounter (Signed)
Pt called in and requested to cancel her procedure.  She is going through a lot right now with her mom.  She said that she may call back to reschedule some time in Sept.

## 2019-11-04 NOTE — Telephone Encounter (Signed)
Communication noted.  

## 2020-01-06 ENCOUNTER — Other Ambulatory Visit (HOSPITAL_COMMUNITY): Payer: BC Managed Care – PPO

## 2020-01-08 ENCOUNTER — Ambulatory Visit (HOSPITAL_COMMUNITY): Admit: 2020-01-08 | Payer: BC Managed Care – PPO | Admitting: Internal Medicine

## 2020-01-08 ENCOUNTER — Encounter (HOSPITAL_COMMUNITY): Payer: Self-pay

## 2020-01-08 SURGERY — COLONOSCOPY
Anesthesia: Moderate Sedation

## 2020-01-23 ENCOUNTER — Emergency Department (HOSPITAL_COMMUNITY)
Admission: EM | Admit: 2020-01-23 | Discharge: 2020-01-23 | Disposition: A | Discharge status: Home / Self Care | Payer: BC Managed Care – PPO | Attending: Emergency Medicine | Admitting: Emergency Medicine

## 2020-01-23 ENCOUNTER — Encounter (HOSPITAL_COMMUNITY): Payer: Self-pay

## 2020-01-23 ENCOUNTER — Emergency Department (HOSPITAL_COMMUNITY): Payer: BC Managed Care – PPO

## 2020-01-23 ENCOUNTER — Other Ambulatory Visit: Payer: Self-pay

## 2020-01-23 DIAGNOSIS — E119 Type 2 diabetes mellitus without complications: Secondary | ICD-10-CM | POA: Insufficient documentation

## 2020-01-23 DIAGNOSIS — Z87891 Personal history of nicotine dependence: Secondary | ICD-10-CM | POA: Insufficient documentation

## 2020-01-23 DIAGNOSIS — B0229 Other postherpetic nervous system involvement: Secondary | ICD-10-CM | POA: Diagnosis not present

## 2020-01-23 LAB — BASIC METABOLIC PANEL
Anion gap: 10 (ref 5–15)
BUN: 17 mg/dL (ref 8–23)
CO2: 21 mmol/L — ABNORMAL LOW (ref 22–32)
Calcium: 9 mg/dL (ref 8.9–10.3)
Chloride: 109 mmol/L (ref 98–111)
Creatinine, Ser: 0.79 mg/dL (ref 0.44–1.00)
GFR calc Af Amer: >60 mL/min (ref >60)
GFR calc non Af Amer: >60 mL/min (ref >60)
Glucose, Bld: 97 mg/dL (ref 70–99)
Potassium: 3.5 mmol/L (ref 3.5–5.1)
Sodium: 140 mmol/L (ref 135–145)

## 2020-01-23 LAB — CBC
HCT: 44.6 % (ref 36.0–46.0)
Hemoglobin: 14.4 g/dL (ref 12.0–15.0)
MCH: 30.1 pg (ref 26.0–34.0)
MCHC: 32.3 g/dL (ref 30.0–36.0)
MCV: 93.3 fL (ref 80.0–100.0)
Platelets: 308 10*3/uL (ref 150–400)
RBC: 4.78 MIL/uL (ref 3.87–5.11)
RDW: 14.6 % (ref 11.5–15.5)
WBC: 9.4 10*3/uL (ref 4.0–10.5)
nRBC: 0 % (ref 0.0–0.2)

## 2020-01-23 MED ORDER — GABAPENTIN 100 MG PO CAPS: 100.0000 mg | ORAL_CAPSULE | Freq: Three times a day (TID) | ORAL | 1 refills | Status: DC

## 2020-01-23 NOTE — Discharge Instructions (Addendum)
You were evaluated in the Emergency Department and after careful evaluation, we did not find any emergent condition requiring admission or further testing in the hospital.  Your exam/testing today was overall reassuring.  Your symptoms seem to be due to continued nerve pain from the recent shingles.  You can continue the ibuprofen at home.  You can also try the gabapentin medication prescribed today.  We recommend continued follow-up with your primary care doctor.  Please return to the Emergency Department if you experience any worsening of your condition.  Thank you for allowing [place] to be a part of your care.

## 2020-01-23 NOTE — ED Triage Notes (Signed)
Pt reports left leg numb and had a rash around July 14.  Says went to pcp and was treated for shingles.  Reports rash has cleared but still having pain and numbness.  Reports has fallen twice today.

## 2020-01-23 NOTE — ED Provider Notes (Signed)
[facility] Emergency Department Provider Note MRN:  [id number]  Arrival date & time: 01/23/20     Chief Complaint   Leg pain History of Present Illness   [name] is a 64 y.o. year-old female with a history of diabetes presenting to the ED with chief complaint of leg pain.  Patient explains that she is recently recovering from a rash on her left leg and was diagnosed with shingles.  She completed her medications for it.  She continues to have hypersensitive skin, feels numb and weak.  Denies numbness or weakness to the arms, no trouble with speech, no fever, no other complaints.  Feels unsteady on the leg.  Review of Systems  A complete 10 system review of systems was obtained and all systems are negative except as noted in the HPI and PMH.   Patient's Health History    Past Medical History:  Diagnosis Date  . Depression   . Diabetes mellitus without complication ([place])   . Hypercholesterolemia   . Obesity   . Obstructive sleep apnea on CPAP     Past Surgical History:  Procedure Laterality Date  . ANAL FISSURE REPAIR  2012   Dr. Arnoldo Morale  . COLONOSCOPY  01/29/2003   NUR: Five small polyps that were ablated/Tiny diverticula at sigmoid colon  . COLONOSCOPY  03/03/2010   XBJ:YNWG canal papilla tender anal canal otherwise normal  . COLONOSCOPY N/A 06/21/2014   Procedure: COLONOSCOPY;  Surgeon: Daneil Dolin, MD;  Location: [facility]O SUITE;  Service: Endoscopy;  Laterality: N/A;  745AM  . INCISE AND DRAIN ABCESS      Family History  Problem Relation Age of Onset  . Colon cancer Neg Hx     Social History   Socioeconomic History  . Marital status: Married    Spouse name: Not on file  . Number of children: Not on file  . Years of education: Not on file  . Highest education level: Not on file  Occupational History  . Not on file  Tobacco Use  . Smoking status: Former Smoker    Quit date: 02/26/2000    Years since quitting: 19.9  . Smokeless  tobacco: Never Used  Substance and Sexual Activity  . Alcohol use: No    Alcohol/week: 0.0 standard drinks  . Drug use: No  . Sexual activity: Not on file  Other Topics Concern  . Not on file  Social History Narrative  . Not on file   Social Determinants of Health   Financial Resource Strain:   . Difficulty of Paying Living Expenses:   Food Insecurity:   . Worried About Charity fundraiser in the Last Year:   . Arboriculturist in the Last Year:   Transportation Needs:   . Film/video editor (Medical):   Marland Kitchen Lack of Transportation (Non-Medical):   Physical Activity:   . Days of Exercise per Week:   . Minutes of Exercise per Session:   Stress:   . Feeling of Stress :   Social Connections:   . Frequency of Communication with Friends and Family:   . Frequency of Social Gatherings with Friends and Family:   . Attends Religious Services:   . Active Member of Clubs or Organizations:   . Attends Archivist Meetings:   Marland Kitchen Marital Status:   Intimate Partner Violence:   . Fear of Current or Ex-Partner:   . Emotionally Abused:   Marland Kitchen Physically Abused:   . Sexually  Abused:      Physical Exam   Vitals:   01/23/20 1444 01/23/20 1607  BP: (!) 148/90 (!) 176/92  Pulse: 71 63  Resp: 16 16  Temp:  97.6 F (36.4 C)  SpO2: 97% 96%    CONSTITUTIONAL: Well-appearing, NAD NEURO:  Alert and oriented x 3, no focal deficits EYES:  eyes equal and reactive ENT/NECK:  no LAD, no JVD CARDIO: Regular rate, well-perfused, normal S1 and S2 PULM:  CTAB no wheezing or rhonchi GI/GU:  normal bowel sounds, non-distended, non-tender MSK/SPINE:  No gross deformities, no edema SKIN:  no rash, atraumatic PSYCH:  Appropriate speech and behavior  *Additional and/or pertinent findings included in MDM below  Diagnostic and Interventional Summary    EKG Interpretation  Date/Time:    Ventricular Rate:    PR Interval:    QRS Duration:   QT Interval:    QTC Calculation:   R Axis:       Text Interpretation:        Labs Reviewed  BASIC METABOLIC PANEL - Abnormal; Notable for the following components:      Result Value   CO2 21 (*)    All other components within normal limits  CBC    CT HEAD WO CONTRAST  Final Result      Medications - No data to display   Procedures  /  Critical Care Procedures  ED Course and Medical Decision Making  I have reviewed the triage vital signs, the nursing notes, and pertinent available records from the EMR.  Listed above are laboratory and imaging tests that I personally ordered, reviewed, and interpreted and then considered in my medical decision making (see below for details).      Most likely postherpetic neuralgia given the hypersensitive skin on exam, though my initial exam she exhibited either poor effort or weakness.  CT head obtained to screen for signs of acute infarction, and this appears normal.  On my repeat evaluation she has improved mobility, this is inconsistent with an acute CNS problem, seems consistent with postherpetic neuralgia, no signs of infection, neurovascularly intact extremity, will trial gabapentin advised PCP follow-up.    Barth Kirks. Sedonia Small, [place] [email or web address]  Final Clinical Impressions(s) / ED Diagnoses     ICD-10-CM   1. Postherpetic neuralgia  B02.29     ED Discharge Orders         Ordered    gabapentin (NEURONTIN) 100 MG capsule  3 times daily     Discontinue  Reprint     01/23/20 1618           Discharge Instructions Discussed with and Provided to Patient:     Discharge Instructions     You were evaluated in the Emergency Department and after careful evaluation, we did not find any emergent condition requiring admission or further testing in the hospital.  Your exam/testing today was overall reassuring.  Your symptoms seem to be due to continued nerve pain from the recent shingles.  You can continue the ibuprofen at  home.  You can also try the gabapentin medication prescribed today.  We recommend continued follow-up with your primary care doctor.  Please return to the Emergency Department if you experience any worsening of your condition.  Thank you for allowing [place] to be a part of your care.       Maudie Flakes, MD 01/23/20 [id number]

## 2020-08-22 ENCOUNTER — Other Ambulatory Visit: Payer: Self-pay

## 2020-08-22 ENCOUNTER — Ambulatory Visit (INDEPENDENT_AMBULATORY_CARE_PROVIDER_SITE_OTHER): Payer: Self-pay | Admitting: *Deleted

## 2020-08-22 VITALS — Ht 68.0 in | Wt 300.0 lb

## 2020-08-22 DIAGNOSIS — Z86010 Personal history of colonic polyps: Secondary | ICD-10-CM

## 2020-08-22 NOTE — Progress Notes (Addendum)
Gastroenterology Pre-Procedure Review  Request Date: 08/22/2020 Requesting Physician: 3 year recall, was due in 2018, Last TCS 06/21/2014 done by Dr. Gala Romney, tubular adenoma, Last triage 10/15/2019  PATIENT REVIEW QUESTIONS: The patient responded to the following health history questions as indicated:    1. Diabetes Melitis:  yes, type II 2. Joint replacements in the past 12 months: no 3. Major health problems in the past 3 months: yes, pt states diagnosed with shingles 01/03/2020, has them but no breakouts, just nerves 4. Has an artificial valve or MVP: no 5. Has a defibrillator: no 6. Has been advised in past to take antibiotics in advance of a procedure like teeth cleaning: no 7. Family history of colon cancer: no  8. Alcohol Use: no 9. Illicit drug Use: no 10. History of sleep apnea: yes, CPAP  11. History of coronary artery or other vascular stents placed within the last 12 months: no 12. History of any prior anesthesia complications: no 13. Body mass index is 45.61 kg/m.    MEDICATIONS & ALLERGIES:    Patient reports the following regarding taking any blood thinners:   Plavix? no Aspirin? no Coumadin? no Brilinta? no Xarelto? no Eliquis? no Pradaxa? no Savaysa? no Effient? no  Patient confirms/reports the following medications:  Current Outpatient Medications  Medication Sig Dispense Refill  . acetaminophen (TYLENOL) 500 MG tablet Take 500 mg by mouth as needed.    Marland Kitchen atorvastatin (LIPITOR) 80 MG tablet Take 80 mg by mouth daily.    . Cholecalciferol (VITAMIN D PO) Take 1 tablet by mouth daily.    . citalopram (CELEXA) 40 MG tablet Take 40 mg by mouth daily.    Marland Kitchen docusate sodium (COLACE) 100 MG capsule Take 100 mg by mouth 2 (two) times daily.    Marland Kitchen glimepiride (AMARYL) 4 MG tablet Take 4 mg by mouth every morning. Takes 2 tablets every morning.    . metFORMIN (GLUCOPHAGE) 500 MG tablet Take 1,000 mg by mouth 2 (two) times daily.     . pioglitazone (ACTOS) 15 MG tablet  Take 15 mg by mouth daily.    . Pseudoephedrine-Ibuprofen 30-200 MG TABS Take by mouth as needed.    . vitamin B-12 (CYANOCOBALAMIN) 1000 MCG tablet Take 1,000 mcg by mouth daily.     No current facility-administered medications for this visit.    Patient confirms/reports the following allergies:  Allergies  Allergen Reactions  . Clomipramine Hcl Other (See Comments)    Caused hot flashes  . Elemental Sulfur Hives  . Sulfa Antibiotics Hives    No orders of the defined types were placed in this encounter.   AUTHORIZATION INFORMATION Primary Insurance: Medicare,  ID #: [id number] Pre-Cert / Auth required: No, not required  Secondary Insurance: [employer] Supplement,  ID #: [id number],  Group #: Plan [id number] Pre-Cert / Auth required:  Pre-Cert / Auth #:   SCHEDULE INFORMATION: Procedure has been scheduled as follows:  Date: 10/19/2020, Time: AM procedure Location: [facility] with Dr. Gala Romney  This Gastroenterology Pre-Precedure Review Form is being routed to the following provider(s): Walden Field, NP

## 2020-08-22 NOTE — Patient Instructions (Addendum)
[name]  05/20/1956 MRN: [id number]     Procedure Date:  10/19/2020 Time to register: You will receive a call from the hospital a few days before your procedure. Place to register: [facility] Scheduled provider: Dr. Gala Romney    PREPARATION FOR COLONOSCOPY WITH SUPREP BOWEL PREP KIT  Note: Suprep Bowel Prep Kit is a split-dose (2day) regimen. Consumption of BOTH 6-ounce bottles is required for a complete prep.  Please notify us immediately if you are diabetic, take iron supplements, or if you are on Coumadin or any other blood thinners.  Please hold the following medications:   DM meds: Amaryl: Half the night before and none the morning of procedure; Glucophage, Actos: None morning of procedure                                                                                                                                                  2 DAYS BEFORE PROCEDURE:  DATE: 10/17/2020   DAY: Monday Begin clear liquid diet AFTER your lunch meal. NO SOLID FOODS after this point.  1 DAY BEFORE PROCEDURE:  DATE: 10/18/2020   DAY: Tuesday Continue clear liquids the entire day - NO SOLID FOOD.   Diabetic medications adjustments for today: See letter  At 8:00am: Complete steps 1 through 4 below, using ONE (1) 6-ounce bottle, before going to bed. Step 1:  Pour ONE (1) 6-ounce bottle of SUPREP liquid into the mixing container.  Step 2:  Add cool drinking water to the 16 ounce line on the container and mix.  Note: Dilute the solution concentrate as directed prior to use. Step 3:  DRINK ALL the liquid in the container. Step 4:  You MUST drink an additional two (2) or more 16 ounce containers of water over the next one (1) hour.    At 6:00pm: Complete steps 1 through 4 below, using ONE (1) 6-ounce bottle, before going to bed. Step 1:  Pour ONE (1) 6-ounce bottle of SUPREP liquid into the mixing container.  Step 2:  Add cool drinking water to the 16 ounce line on the  container and mix.  Note: Dilute the solution concentrate as directed prior to use. Step 3:  DRINK ALL the liquid in the container. Step 4:  You MUST drink an additional two (2) or more 16 ounce containers of water over the next one (1) hour.   Continue clear liquids until midnight.  Nothing by mouth after midnight.  DAY OF PROCEDURE:   DATE: 10/19/2020   DAY: Wednesday If you take medications for your heart, blood pressure, or breathing, you may take these medications.  Diabetic medications adjustments for today: See letter  You may take your morning medications with sip of water unless we have instructed otherwise.    Please see below for Dietary Information.  CLEAR LIQUIDS  INCLUDE:  Water Jello (NOT red in color)   Ice Popsicles (NOT red in color)   Tea (sugar ok, no milk/cream) Powdered fruit flavored drinks  Coffee (sugar ok, no milk/cream) Gatorade/ Lemonade/ Kool-Aid  (NOT red in color)   Juice: apple, white grape, white cranberry Soft drinks  Clear bullion, consomme, broth (fat free beef/chicken/vegetable)  Carbonated beverages (any kind)  Strained chicken noodle soup Hard Candy   Remember: Clear liquids are liquids that will allow you to see your fingers on the other side of a clear glass. Be sure liquids are NOT red in color, and not cloudy, but CLEAR.  DO NOT EAT OR DRINK ANY OF THE FOLLOWING:  Dairy products of any kind   Cranberry juice Tomato juice / V8 juice   Grapefruit juice Orange juice     Red grape juice  Do not eat any solid foods, including such foods as: cereal, oatmeal, yogurt, fruits, vegetables, creamed soups, eggs, bread, crackers, pureed foods in a blender, etc.   HELPFUL HINTS FOR DRINKING PREP SOLUTION:   Make sure prep is extremely cold. Mix and refrigerate the the morning of the prep. You may also put in the freezer.   You may try mixing some Crystal Light or Country Time Lemonade if you prefer. Mix in small amounts; add more if necessary.  Try  drinking through a straw  Rinse mouth with water or a mouthwash between glasses, to remove after-taste.  Try sipping on a cold beverage /ice/ popsicles between glasses of prep.  Place a piece of sugar-free hard candy in mouth between glasses.  If you become nauseated, try consuming smaller amounts, or stretch out the time between glasses. Stop for 30-60 minutes, then slowly start back drinking.     OTHER INSTRUCTIONS  You will need a responsible adult at least 66 years of age to accompany you and drive you home. This person must remain in the waiting room during your procedure. The hospital will cancel your procedure if you do not have a responsible adult with you.   1. Wear loose fitting clothing that is easily removed. 2. Leave jewelry and other valuables at home.  3. Remove all body piercing jewelry and leave at home. 4. Total time from sign-in until discharge is approximately 2-3 hours. 5. You should go home directly after your procedure and rest. You can resume normal activities the day after your procedure. 6. The day of your procedure you should not:  Drive  Make legal decisions  Operate machinery  Drink alcohol  Return to work   You may call the office (Dept: [phone]) before 5:00pm, or page the doctor on call [phone]) after 5:00pm, for further instructions, if necessary.   Insurance Information YOU WILL NEED TO CHECK WITH YOUR INSURANCE COMPANY FOR THE BENEFITS OF COVERAGE YOU HAVE FOR THIS PROCEDURE.  UNFORTUNATELY, NOT ALL INSURANCE COMPANIES HAVE BENEFITS TO COVER ALL OR PART OF THESE TYPES OF PROCEDURES.  IT IS YOUR RESPONSIBILITY TO CHECK YOUR BENEFITS, HOWEVER, WE WILL BE GLAD TO ASSIST YOU WITH ANY CODES YOUR INSURANCE COMPANY MAY NEED.    PLEASE NOTE THAT MOST INSURANCE COMPANIES WILL NOT COVER A SCREENING COLONOSCOPY FOR PEOPLE UNDER THE AGE OF 50  IF YOU HAVE BCBS INSURANCE, YOU MAY HAVE BENEFITS FOR A SCREENING COLONOSCOPY BUT IF POLYPS ARE  FOUND THE DIAGNOSIS WILL CHANGE AND THEN YOU MAY HAVE A DEDUCTIBLE THAT WILL NEED TO BE MET. SO PLEASE MAKE SURE YOU CHECK YOUR BENEFITS FOR A SCREENING COLONOSCOPY  AS WELL AS A DIAGNOSTIC COLONOSCOPY.      

## 2020-08-26 NOTE — Progress Notes (Signed)
Ok to schedule.   DM meds: Amaryl: Half the night before and none the morning of.; Glucophage, Actos: None morning of  On prep day: Check CBG ac and hs as well (if they normally check their blood sugar) as if the patient feels like their blood sugar is off. Can use soda, juice (that's in [place]) as needed for any low blood sugar.  Check CBG on arrival to endo unit.  ASA I/II (no weight/BMI available in Epic; well controlled DM based on FBS, no hgb a1c available)

## 2020-08-29 ENCOUNTER — Encounter: Payer: Self-pay | Admitting: *Deleted

## 2020-08-29 ENCOUNTER — Telehealth: Payer: Self-pay | Admitting: Internal Medicine

## 2020-08-29 MED ORDER — NA SULFATE-K SULFATE-MG SULF 17.5-3.13-1.6 GM/177ML PO SOLN: 1.0000 | Freq: Once | ORAL | 0 refills | Status: AC

## 2020-08-29 NOTE — Addendum Note (Signed)
Addended by: Metro Kung on: 08/29/2020 01:03 PM   Modules accepted: Orders

## 2020-08-29 NOTE — Progress Notes (Signed)
Spoke with pt.  She is coming by the office to pick up prep instructions, covid screening instructions, diabetes medication adjustment letter, and coupon code for [employer].  Left information up front.

## 2020-08-29 NOTE — Telephone Encounter (Signed)
PLEASE CALL PATIENT, SHE GOT A MESSAGE THAT HER TCS PREP WAS READY AND SHE DID NOT SCHEDULE A PROCEDURE

## 2020-08-29 NOTE — Telephone Encounter (Addendum)
Returned pt's call.  Scheduled pt's procedure for 10/19/2020 AM procedure and Covid screening for 10/17/2020 at 3:15.  Pt made aware that Day Surgery will call her with procedure time.  Pt is coming to pick up instructions along with Suprep coupon code.

## 2020-10-17 ENCOUNTER — Other Ambulatory Visit: Payer: Self-pay

## 2020-10-17 ENCOUNTER — Encounter (HOSPITAL_COMMUNITY): Payer: Self-pay | Admitting: Internal Medicine

## 2020-10-17 ENCOUNTER — Other Ambulatory Visit (HOSPITAL_COMMUNITY)
Admission: RE | Admit: 2020-10-17 | Discharge: 2020-10-17 | Disposition: A | Discharge status: Home / Self Care | Payer: Medicare Other | Source: Ambulatory Visit | Attending: Internal Medicine | Admitting: Internal Medicine

## 2020-10-17 DIAGNOSIS — Z01812 Encounter for preprocedural laboratory examination: Secondary | ICD-10-CM | POA: Diagnosis present

## 2020-10-17 DIAGNOSIS — Z20822 Contact with and (suspected) exposure to covid-19: Secondary | ICD-10-CM | POA: Insufficient documentation

## 2020-10-18 LAB — SARS CORONAVIRUS 2 (TAT 6-24 HRS): SARS Coronavirus 2: NEGATIVE

## 2020-10-19 ENCOUNTER — Encounter (HOSPITAL_COMMUNITY)
Admission: RE | Disposition: A | Discharge status: Home / Self Care | Payer: Self-pay | Source: Home / Self Care | Attending: Internal Medicine

## 2020-10-19 ENCOUNTER — Encounter (HOSPITAL_COMMUNITY): Payer: Self-pay | Admitting: Internal Medicine

## 2020-10-19 ENCOUNTER — Other Ambulatory Visit: Payer: Self-pay

## 2020-10-19 ENCOUNTER — Ambulatory Visit (HOSPITAL_COMMUNITY)
Admission: RE | Admit: 2020-10-19 | Discharge: 2020-10-19 | Disposition: A | Discharge status: Home / Self Care | Payer: Medicare Other | Attending: Internal Medicine | Admitting: Internal Medicine

## 2020-10-19 DIAGNOSIS — Z1211 Encounter for screening for malignant neoplasm of colon: Secondary | ICD-10-CM | POA: Diagnosis not present

## 2020-10-19 DIAGNOSIS — Z7984 Long term (current) use of oral hypoglycemic drugs: Secondary | ICD-10-CM | POA: Diagnosis not present

## 2020-10-19 DIAGNOSIS — Z87891 Personal history of nicotine dependence: Secondary | ICD-10-CM | POA: Diagnosis not present

## 2020-10-19 DIAGNOSIS — K5140 Inflammatory polyps of colon without complications: Secondary | ICD-10-CM | POA: Insufficient documentation

## 2020-10-19 DIAGNOSIS — Z882 Allergy status to sulfonamides status: Secondary | ICD-10-CM | POA: Diagnosis not present

## 2020-10-19 DIAGNOSIS — Z86010 Personal history of colonic polyps: Secondary | ICD-10-CM | POA: Diagnosis present

## 2020-10-19 DIAGNOSIS — Z888 Allergy status to other drugs, medicaments and biological substances status: Secondary | ICD-10-CM | POA: Insufficient documentation

## 2020-10-19 DIAGNOSIS — Z79899 Other long term (current) drug therapy: Secondary | ICD-10-CM | POA: Diagnosis not present

## 2020-10-19 DIAGNOSIS — K5730 Diverticulosis of large intestine without perforation or abscess without bleeding: Secondary | ICD-10-CM | POA: Insufficient documentation

## 2020-10-19 DIAGNOSIS — D120 Benign neoplasm of cecum: Secondary | ICD-10-CM | POA: Diagnosis not present

## 2020-10-19 HISTORY — PX: COLONOSCOPY: SHX5424

## 2020-10-19 HISTORY — PX: POLYPECTOMY: SHX5525

## 2020-10-19 LAB — GLUCOSE, CAPILLARY: Glucose-Capillary: 139 mg/dL — ABNORMAL HIGH (ref 70–99)

## 2020-10-19 SURGERY — COLONOSCOPY
Anesthesia: Moderate Sedation

## 2020-10-19 MED ORDER — ONDANSETRON HCL 4 MG/2ML IJ SOLN
INTRAMUSCULAR | Status: AC
  Filled 2020-10-19: qty 2

## 2020-10-19 MED ORDER — MIDAZOLAM HCL 5 MG/5ML IJ SOLN
INTRAMUSCULAR | Status: DC | PRN
  Administered 2020-10-19 (×4): 1 mg via INTRAVENOUS
  Administered 2020-10-19: 2 mg via INTRAVENOUS
  Administered 2020-10-19: 1 mg via INTRAVENOUS

## 2020-10-19 MED ORDER — MEPERIDINE HCL 50 MG/ML IJ SOLN
INTRAMUSCULAR | Status: AC
  Filled 2020-10-19: qty 1

## 2020-10-19 MED ORDER — MIDAZOLAM HCL 5 MG/5ML IJ SOLN
INTRAMUSCULAR | Status: AC
  Filled 2020-10-19: qty 10

## 2020-10-19 MED ORDER — MEPERIDINE HCL 100 MG/ML IJ SOLN
INTRAMUSCULAR | Status: DC | PRN
  Administered 2020-10-19: 25 mg via INTRAVENOUS
  Administered 2020-10-19: 15 mg via INTRAVENOUS
  Administered 2020-10-19: 10 mg via INTRAVENOUS

## 2020-10-19 MED ORDER — STERILE WATER FOR IRRIGATION IR SOLN
Status: DC | PRN
  Administered 2020-10-19: 1.5 mL

## 2020-10-19 MED ORDER — SODIUM CHLORIDE 0.9 % IV SOLN
INTRAVENOUS | Status: DC
  Administered 2020-10-19: 1000 mL via INTRAVENOUS

## 2020-10-19 MED ORDER — ONDANSETRON HCL 4 MG/2ML IJ SOLN
INTRAMUSCULAR | Status: DC | PRN
  Administered 2020-10-19: 4 mg via INTRAVENOUS

## 2020-10-19 NOTE — H&P (Signed)
@LOGO @   Primary Care Physician:  Lemmie Evens, MD Primary Gastroenterologist:  Dr. Gala Romney  Pre-Procedure History & Physical: HPI:  [name] is a 65 y.o. female here for surveillance colonoscopy.  History multiple colonic adenomas removed 2015.  No bowel symptoms currently.  Past Medical History:  Diagnosis Date  . Depression   . Diabetes mellitus without complication ([place])   . Hypercholesterolemia   . Obesity   . Obstructive sleep apnea on CPAP     Past Surgical History:  Procedure Laterality Date  . ANAL FISSURE REPAIR  2012   Dr. Arnoldo Morale  . COLONOSCOPY  01/29/2003   NUR: Five small polyps that were ablated/Tiny diverticula at sigmoid colon  . COLONOSCOPY  03/03/2010   BJS:EGBT canal papilla tender anal canal otherwise normal  . COLONOSCOPY N/A 06/21/2014   Procedure: COLONOSCOPY;  Surgeon: Daneil Dolin, MD;  Location: [facility];  Service: Endoscopy;  Laterality: N/A;  745AM  . INCISE AND DRAIN ABCESS      Prior to Admission medications   Medication Sig Start Date End Date Taking? Authorizing Provider  acetaminophen (TYLENOL) 500 MG tablet Take 1,000 mg by mouth at bedtime as needed (pain.).   Yes [provider]  atorvastatin (LIPITOR) 80 MG tablet Take 80 mg by mouth in the morning.   Yes [provider]  cholecalciferol (VITAMIN D) 25 MCG (1000 UNIT) tablet Take 1,000 Units by mouth in the morning.   Yes [provider]  citalopram (CELEXA) 40 MG tablet Take 40 mg by mouth in the morning.   Yes [provider]  docusate sodium (COLACE) 100 MG capsule Take 200 mg by mouth every evening.   Yes [provider]  glimepiride (AMARYL) 4 MG tablet Take 8 mg by mouth every morning. Takes 2 tablets every morning. 10/12/19  Yes [provider]  metFORMIN (GLUCOPHAGE-XR) 500 MG 24 hr tablet Take 1,000 mg by mouth 2 (two) times daily. 06/13/20  Yes [provider]  pioglitazone (ACTOS) 15 MG tablet Take 15  mg by mouth in the morning.   Yes [provider]  vitamin B-12 (CYANOCOBALAMIN) 1000 MCG tablet Take 1,000 mcg by mouth in the morning.   Yes [provider]    Allergies as of 08/29/2020 - Review Complete 08/22/2020  Allergen Reaction Noted  . Clomipramine hcl Other (See Comments) 11/22/2016  . Elemental sulfur Hives 08/05/2013  . Sulfa antibiotics Hives 08/05/2013    Family History  Problem Relation Age of Onset  . Colon cancer Neg Hx     Social History   Socioeconomic History  . Marital status: Married    Spouse name: Not on file  . Number of children: Not on file  . Years of education: Not on file  . Highest education level: Not on file  Occupational History  . Not on file  Tobacco Use  . Smoking status: Former Smoker    Quit date: 02/26/2000    Years since quitting: 20.6  . Smokeless tobacco: Never Used  Vaping Use  . Vaping Use: Never used  Substance and Sexual Activity  . Alcohol use: No    Alcohol/week: 0.0 standard drinks  . Drug use: No  . Sexual activity: Not on file  Other Topics Concern  . Not on file  Social History Narrative  . Not on file   Social Determinants of Health   Financial Resource Strain: Not on file  Food Insecurity: Not on file  Transportation Needs: Not on file  Physical Activity: Not on file  Stress: Not on file  Social Connections: Not on file  Intimate Partner Violence: Not on file    Review of Systems: See HPI, otherwise negative ROS  Physical Exam: BP (!) 154/79   Pulse 71   Temp 97.9 F (36.6 C) (Oral)   Resp 19   Ht 5\' 7"  (8.144 m)   Wt 136.1 kg   SpO2 97%   BMI 46.99 kg/m  General:   Alert,  Well-developed, well-nourished, pleasant and cooperative in NAD Neck:  Supple; no masses or thyromegaly. No significant cervical adenopathy. Lungs:  Clear throughout to auscultation.   No wheezes, crackles, or rhonchi. No acute distress. Heart:  Regular rate and rhythm; no murmurs, clicks, rubs,  or  gallops. Abdomen: Non-distended, normal bowel sounds.  Soft and nontender without appreciable mass or hepatosplenomegaly.  Pulses:  Normal pulses noted. Extremities:  Without clubbing or edema.  Impression/Plan: 65 year old lady here for a surveillance colonoscopy.  History of multiple colonic adenomas removed over time.  I have offered the patient a surveillance colonoscopy today.  The risks, benefits, limitations, alternatives and imponderables have been reviewed with the patient. Questions have been answered. All parties are agreeable.      Notice: This dictation was prepared with Dragon dictation along with smaller phrase technology. Any transcriptional errors that result from this process are unintentional and may not be corrected upon review.

## 2020-10-19 NOTE — Op Note (Signed)
[facility] Patient Name: [name] Procedure Date: 10/19/2020 9:22 AM MRN: [id number] Date of Birth: 25-Jan-1956 Attending MD: Norvel Richards , MD CSN: [id number] Age: 65 Admit Type: Outpatient Procedure:                Colonoscopy Indications:              High risk colon cancer surveillance: Personal                            history of colonic polyps Providers:                Norvel Richards, MD, Jeanann Lewandowsky. Sharon Seller, RN,                            Raphael Gibney, Technician Referring MD:              Medicines:                Midazolam 7 mg IV, Meperidine 50 mg IV Complications:            No immediate complications. Estimated Blood Loss:     Estimated blood loss was minimal. Procedure:                Pre-Anesthesia Assessment:                           - Prior to the procedure, a History and Physical                            was performed, and patient medications and                            allergies were reviewed. The patient's tolerance of                            previous anesthesia was also reviewed. The risks                            and benefits of the procedure and the sedation                            options and risks were discussed with the patient.                            All questions were answered, and informed consent                            was obtained. Prior Anticoagulants: The patient has                            taken no previous anticoagulant or antiplatelet                            agents. ASA Grade Assessment: III - A patient with  severe systemic disease. After reviewing the risks                            and benefits, the patient was deemed in                            satisfactory condition to undergo the procedure.                           After obtaining informed consent, the colonoscope                            was passed under direct vision. Throughout the                             procedure, the patient's blood pressure, pulse, and                            oxygen saturations were monitored continuously. The                            CF-[id number] ([id number]) scope was introduced through                            the anus and advanced to the the cecum, identified                            by appendiceal orifice and ileocecal valve. The                            colonoscopy was performed without difficulty. The                            patient tolerated the procedure well. The quality                            of the bowel preparation was adequate. The                            ileocecal valve, appendiceal orifice, and rectum                            were photographed. The entire colon was well                            visualized. Scope In: 9:45:15 AM Scope Out: 10:02:29 AM Scope Withdrawal Time: 0 hours 8 minutes 55 seconds  Total Procedure Duration: 0 hours 17 minutes 14 seconds  Findings:      The perianal and digital rectal examinations were normal.      Scattered medium-mouthed diverticula were found in the sigmoid colon and       descending colon. Elongated, redundant colon requiring external       abdominal pressure to reach the cecum.      A 7 mm polyp was found  in the cecum. The polyp was pedunculated. The       polyp was removed with a cold snare. Resection and retrieval were       complete. Estimated blood loss was minimal.      The exam was otherwise without abnormality on direct and retroflexion       views. Impression:               - Diverticulosis in the sigmoid colon and in the                            descending colon.                           - One 7 mm polyp in the cecum, removed with a cold                            snare. Resected and retrieved.                           - The examination was otherwise normal on direct                            and retroflexion views. Moderate Sedation:      Moderate (conscious) sedation was  personally administered by an       anesthesia professional. The following parameters were monitored: oxygen       saturation, heart rate, blood pressure, and response to care. Total       physician intraservice time was 27 minutes. Recommendation:           - Patient has a contact number available for                            emergencies. The signs and symptoms of potential                            delayed complications were discussed with the                            patient. Return to normal activities tomorrow.                            Written discharge instructions were provided to the                            patient.                           - Advance diet as tolerated.                           - Continue present medications.                           - Repeat colonoscopy date to be determined after  pending pathology results are reviewed for                            surveillance.                           - Return to GI office (date not yet determined). Procedure Code(s):        --- Professional ---                           [phone], Colonoscopy, flexible; with removal of                            tumor(s), polyp(s), or other lesion(s) by snare                            technique Diagnosis Code(s):        --- Professional ---                           Z86.010, Personal history of colonic polyps                           K63.5, Polyp of colon                           K57.30, Diverticulosis of large intestine without                            perforation or abscess without bleeding CPT copyright 2019 American Medical Association. All rights reserved. The codes documented in this report are preliminary and upon coder review may  be revised to meet current compliance requirements. Cristopher Estimable. Eboney Claybrook, MD Norvel Richards, MD 10/19/2020 10:07:46 AM This report has been signed electronically. Number of Addenda: 0

## 2020-10-19 NOTE — Discharge Instructions (Signed)
Colonoscopy, Adult, Care After This sheet gives you information about how to care for yourself after your procedure. Your doctor may also give you more specific instructions. If you have problems or questions, call your doctor. What can I expect after the procedure? After the procedure, it is common to have:  A small amount of blood in your poop (stool) for 24 hours.  Some gas.  Mild cramping or bloating in your belly (abdomen). Follow these instructions at home: Eating and drinking  Drink enough fluid to keep your pee (urine) pale yellow.  Follow instructions from your doctor about what you cannot eat or drink.  Return to your normal diet as told by your doctor. Avoid heavy or fried foods that are hard to digest.   Activity  Rest as told by your doctor.  Do not sit for a long time without moving. Get up to take short walks every 1-2 hours. This is important. Ask for help if you feel weak or unsteady.  Return to your normal activities as told by your doctor. Ask your doctor what activities are safe for you. To help cramping and bloating:  Try walking around.  Put heat on your belly as told by your doctor. Use the heat source that your doctor recommends, such as a moist heat pack or a heating pad. ? Put a towel between your skin and the heat source. ? Leave the heat on for 20-30 minutes. ? Remove the heat if your skin turns bright red. This is very important if you are unable to feel pain, heat, or cold. You may have a greater risk of getting burned.   General instructions  If you were given a medicine to help you relax (sedative) during your procedure, it can affect you for many hours. Do not drive or use machinery until your doctor says that it is safe.  For the first 24 hours after the procedure: ? Do not sign important documents. ? Do not drink alcohol. ? Do your daily activities more slowly than normal. ? Eat foods that are soft and easy to digest.  Take  over-the-counter or prescription medicines only as told by your doctor.  Keep all follow-up visits as told by your doctor. This is important. Contact a doctor if:  You have blood in your poop 2-3 days after the procedure. Get help right away if:  You have more than a small amount of blood in your poop.  You see large clumps of tissue (blood clots) in your poop.  Your belly is swollen.  You feel like you may vomit (nauseous).  You vomit.  You have a fever.  You have belly pain that gets worse, and medicine does not help your pain. Summary  After the procedure, it is common to have a small amount of blood in your poop. You may also have mild cramping and bloating in your belly.  If you were given a medicine to help you relax (sedative) during your procedure, it can affect you for many hours. Do not drive or use machinery until your doctor says that it is safe.  Get help right away if you have a lot of blood in your poop, feel like you may vomit, have a fever, or have more belly pain. This information is not intended to replace advice given to you by your health care provider. Make sure you discuss any questions you have with your health care provider. Document Revised: 04/17/2019 Document Reviewed: 01/05/2019 Elsevier Patient Education  [place].     Colon Polyps  Colon polyps are tissue growths inside the colon, which is part of the large intestine. They are one of the types of polyps that can grow in the body. A polyp may be a round bump or a mushroom-shaped growth. You could have one polyp or more than one. Most colon polyps are noncancerous (benign). However, some colon polyps can become cancerous over time. Finding and removing the polyps early can help prevent this. What are the causes? The exact cause of colon polyps is not known. What increases the risk? The following factors may make you more likely to develop this condition:  Having a family history of  colorectal cancer or colon polyps.  Being older than 66 years of age.  Being younger than 66 years of age and having a significant family history of colorectal cancer or colon polyps or a genetic condition that puts you at higher risk of getting colon polyps.  Having inflammatory bowel disease, such as ulcerative colitis or Crohn's disease.  Having certain conditions passed from parent to child (hereditary conditions), such as: ? Familial adenomatous polyposis (FAP). ? Lynch syndrome. ? Turcot syndrome. ? Peutz-Jeghers syndrome. ? MUTYH-associated polyposis (MAP).  Being overweight.  Certain lifestyle factors. These include smoking cigarettes, drinking too much alcohol, not getting enough exercise, and eating a diet that is high in fat and red meat and low in fiber.  Having had childhood cancer that was treated with radiation of the abdomen. What are the signs or symptoms? Many times, there are no symptoms. If you have symptoms, they may include:  Blood coming from the rectum during a bowel movement.  Blood in the stool (feces). The blood may be bright red or very dark in color.  Pain in the abdomen.  A change in bowel habits, such as constipation or diarrhea. How is this diagnosed? This condition is diagnosed with a colonoscopy. This is a procedure in which a lighted, flexible scope is inserted into the opening between the buttocks (anus) and then passed into the colon to examine the area. Polyps are sometimes found when a colonoscopy is done as part of routine cancer screening tests. How is this treated? This condition is treated by removing any polyps that are found. Most polyps can be removed during a colonoscopy. Those polyps will then be tested for cancer. Additional treatment may be needed depending on the results of testing. Follow these instructions at home: Eating and drinking  Eat foods that are high in fiber, such as fruits, vegetables, and whole grains.  Eat foods  that are high in calcium and vitamin D, such as milk, cheese, yogurt, eggs, liver, fish, and broccoli.  Limit foods that are high in fat, such as fried foods and desserts.  Limit the amount of red meat, precooked or cured meat, or other processed meat that you eat, such as hot dogs, sausages, bacon, or meat loaves.  Limit sugary drinks.   Lifestyle  Maintain a healthy weight, or lose weight if recommended by your health care provider.  Exercise every day or as told by your health care provider.  Do not use any products that contain nicotine or tobacco, such as cigarettes, e-cigarettes, and chewing tobacco. If you need help quitting, ask your health care provider.  Do not drink alcohol if: ? Your health care provider tells you not to drink. ? You are pregnant, may be pregnant, or are planning to become pregnant.  If you drink alcohol: ?  Limit how much you use to:  0-1 drink a day for women.  0-2 drinks a day for men. ? Know how much alcohol is in your drink. In the [place] one drink equals one 12 oz bottle of beer (355 mL), one 5 oz glass of wine (148 mL), or one 1 oz glass of hard liquor (44 mL). General instructions  Take over-the-counter and prescription medicines only as told by your health care provider.  Keep all follow-up visits. This is important. This includes having regularly scheduled colonoscopies. Talk to your health care provider about when you need a colonoscopy. Contact a health care provider if:  You have new or worsening bleeding during a bowel movement.  You have new or increased blood in your stool.  You have a change in bowel habits.  You lose weight for no known reason. Summary  Colon polyps are tissue growths inside the colon, which is part of the large intestine. They are one type of polyp that can grow in the body.  Most colon polyps are noncancerous (benign), but some can become cancerous over time.  This condition is diagnosed with a  colonoscopy.  This condition is treated by removing any polyps that are found. Most polyps can be removed during a colonoscopy. This information is not intended to replace advice given to you by your health care provider. Make sure you discuss any questions you have with your health care provider. Document Revised: 09/30/2019 Document Reviewed: 09/30/2019 Elsevier Patient Education  2021 [place].     Diverticulosis  Diverticulosis is a condition that develops when small pouches (diverticula) form in the wall of the large intestine (colon). The colon is where water is absorbed and stool (feces) is formed. The pouches form when the inside layer of the colon pushes through weak spots in the outer layers of the colon. You may have a few pouches or many of them. The pouches usually do not cause problems unless they become inflamed or infected. When this happens, the condition is called diverticulitis. What are the causes? The cause of this condition is not known. What increases the risk? The following factors may make you more likely to develop this condition:  Being older than age 26. Your risk for this condition increases with age. Diverticulosis is rare among people younger than age 78. By age 70, many people have it.  Eating a low-fiber diet.  Having frequent constipation.  Being overweight.  Not getting enough exercise.  Smoking.  Taking over-the-counter pain medicines, like aspirin and ibuprofen.  Having a family history of diverticulosis. What are the signs or symptoms? In most people, there are no symptoms of this condition. If you do have symptoms, they may include:  Bloating.  Cramps in the abdomen.  Constipation or diarrhea.  Pain in the lower left side of the abdomen. How is this diagnosed? Because diverticulosis usually has no symptoms, it is most often diagnosed during an exam for other colon problems. The condition may be diagnosed by:  Using a flexible  scope to examine the colon (colonoscopy).  Taking an X-ray of the colon after dye has been put into the colon (barium enema).  Having a CT scan. How is this treated? You may not need treatment for this condition. Your health care provider may recommend treatment to prevent problems. You may need treatment if you have symptoms or if you previously had diverticulitis. Treatment may include:  Eating a high-fiber diet.  Taking a fiber supplement.  Taking a  live bacteria supplement (probiotic).  Taking medicine to relax your colon.   Follow these instructions at home: Medicines  Take over-the-counter and prescription medicines only as told by your health care provider.  If told by your health care provider, take a fiber supplement or probiotic. Constipation prevention Your condition may cause constipation. To prevent or treat constipation, you may need to:  Drink enough fluid to keep your urine pale yellow.  Take over-the-counter or prescription medicines.  Eat foods that are high in fiber, such as beans, whole grains, and fresh fruits and vegetables.  Limit foods that are high in fat and processed sugars, such as fried or sweet foods.   General instructions  Try not to strain when you have a bowel movement.  Keep all follow-up visits as told by your health care provider. This is important. Contact a health care provider if you:  Have pain in your abdomen.  Have bloating.  Have cramps.  Have not had a bowel movement in 3 days. Get help right away if:  Your pain gets worse.  Your bloating becomes very bad.  You have a fever or chills, and your symptoms suddenly get worse.  You vomit.  You have bowel movements that are bloody or black.  You have bleeding from your rectum. Summary  Diverticulosis is a condition that develops when small pouches (diverticula) form in the wall of the large intestine (colon).  You may have a few pouches or many of them.  This  condition is most often diagnosed during an exam for other colon problems.  Treatment may include increasing the fiber in your diet, taking supplements, or taking medicines. This information is not intended to replace advice given to you by your health care provider. Make sure you discuss any questions you have with your health care provider. Document Revised: 01/08/2019 Document Reviewed: 01/08/2019 Elsevier Patient Education  [place].       High-Fiber Eating Plan Fiber, also called dietary fiber, is a type of carbohydrate. It is found foods such as fruits, vegetables, whole grains, and beans. A high-fiber diet can have many health benefits. Your health care provider may recommend a high-fiber diet to help:  Prevent constipation. Fiber can make your bowel movements more regular.  Lower your cholesterol.  Relieve the following conditions: ? Inflammation of veins in the anus (hemorrhoids). ? Inflammation of specific areas of the digestive tract (uncomplicated diverticulosis). ? A problem of the large intestine, also called the colon, that sometimes causes pain and diarrhea (irritable bowel syndrome, or IBS).  Prevent overeating as part of a weight-loss plan.  Prevent heart disease, type 2 diabetes, and certain cancers. What are tips for following this plan? Reading food labels  Check the nutrition facts label on food products for the amount of dietary fiber. Choose foods that have 5 grams of fiber or more per serving.  The goals for recommended daily fiber intake include: ? Men (age 2 or younger): 34-38 g. ? Men (over age 24): 28-34 g. ? Women (age 81 or younger): 25-28 g. ? Women (over age 86): 22-25 g. Your daily fiber goal is _____________ g.   Shopping  Choose whole fruits and vegetables instead of processed forms, such as apple juice or applesauce.  Choose a wide variety of high-fiber foods such as avocados, lentils, oats, and kidney beans.  Read the  nutrition facts label of the foods you choose. Be aware of foods with added fiber. These foods often have high sugar and  sodium amounts per serving. Cooking  Use whole-grain flour for baking and cooking.  Cook with brown rice instead of white rice. Meal planning  Start the day with a breakfast that is high in fiber, such as a cereal that contains 5 g of fiber or more per serving.  Eat breads and cereals that are made with whole-grain flour instead of refined flour or white flour.  Eat brown rice, bulgur wheat, or millet instead of white rice.  Use beans in place of meat in soups, salads, and pasta dishes.  Be sure that half of the grains you eat each day are whole grains. General information  You can get the recommended daily intake of dietary fiber by: ? Eating a variety of fruits, vegetables, grains, nuts, and beans. ? Taking a fiber supplement if you are not able to take in enough fiber in your diet. It is better to get fiber through food than from a supplement.  Gradually increase how much fiber you consume. If you increase your intake of dietary fiber too quickly, you may have bloating, cramping, or gas.  Drink plenty of water to help you digest fiber.  Choose high-fiber snacks, such as berries, raw vegetables, nuts, and popcorn. What foods should I eat? Fruits Berries. Pears. Apples. Oranges. Avocado. Prunes and raisins. Dried figs. Vegetables Sweet potatoes. Spinach. Kale. Artichokes. Cabbage. Broccoli. Cauliflower. Green peas. Carrots. Squash. Grains Whole-grain breads. Multigrain cereal. Oats and oatmeal. Brown rice. Barley. Bulgur wheat. Speed. Quinoa. Bran muffins. Popcorn. Rye wafer crackers. Meats and other proteins Navy beans, kidney beans, and pinto beans. Soybeans. Split peas. Lentils. Nuts and seeds. Dairy Fiber-fortified yogurt. Beverages Fiber-fortified soy milk. Fiber-fortified orange juice. Other foods Fiber bars. The items listed above may not be a  complete list of recommended foods and beverages. Contact a dietitian for more information. What foods should I avoid? Fruits Fruit juice. Cooked, strained fruit. Vegetables Fried potatoes. Canned vegetables. Well-cooked vegetables. Grains White bread. Pasta made with refined flour. White rice. Meats and other proteins Fatty cuts of meat. Fried chicken or fried fish. Dairy Milk. Yogurt. Cream cheese. Sour cream. Fats and oils Butters. Beverages Soft drinks. Other foods Cakes and pastries. The items listed above may not be a complete list of foods and beverages to avoid. Talk with your dietitian about what choices are best for you. Summary  Fiber is a type of carbohydrate. It is found in foods such as fruits, vegetables, whole grains, and beans.  A high-fiber diet has many benefits. It can help to prevent constipation, lower blood cholesterol, aid weight loss, and reduce your risk of heart disease, diabetes, and certain cancers.  Increase your intake of fiber gradually. Increasing fiber too quickly may cause cramping, bloating, and gas. Drink plenty of water while you increase the amount of fiber you consume.  The best sources of fiber include whole fruits and vegetables, whole grains, nuts, seeds, and beans. This information is not intended to replace advice given to you by your health care provider. Make sure you discuss any questions you have with your health care provider. Document Revised: 10/15/2019 Document Reviewed: 10/15/2019 Elsevier Patient Education  2021 [place] and diverticulosis information provided  1 polyp removed from your colon today  Further recommendations to follow pending review of pathology report  At patient request, I called Donne Anon at [phone] -reviewed results.

## 2020-10-20 LAB — SURGICAL PATHOLOGY

## 2020-10-21 ENCOUNTER — Encounter: Payer: Self-pay | Admitting: Internal Medicine

## 2020-10-21 ENCOUNTER — Encounter (HOSPITAL_COMMUNITY): Payer: Self-pay | Admitting: Internal Medicine

## 2020-12-19 IMAGING — MG DIGITAL SCREENING BILAT W/ TOMO W/ CAD
8 series · 8 of 24 positions shown · non-contrast
Comparison: Previous exam(s).

CLINICAL DATA: Screening.

EXAM:
DIGITAL SCREENING BILATERAL MAMMOGRAM WITH TOMO AND CAD

[R MLO synth-2D]
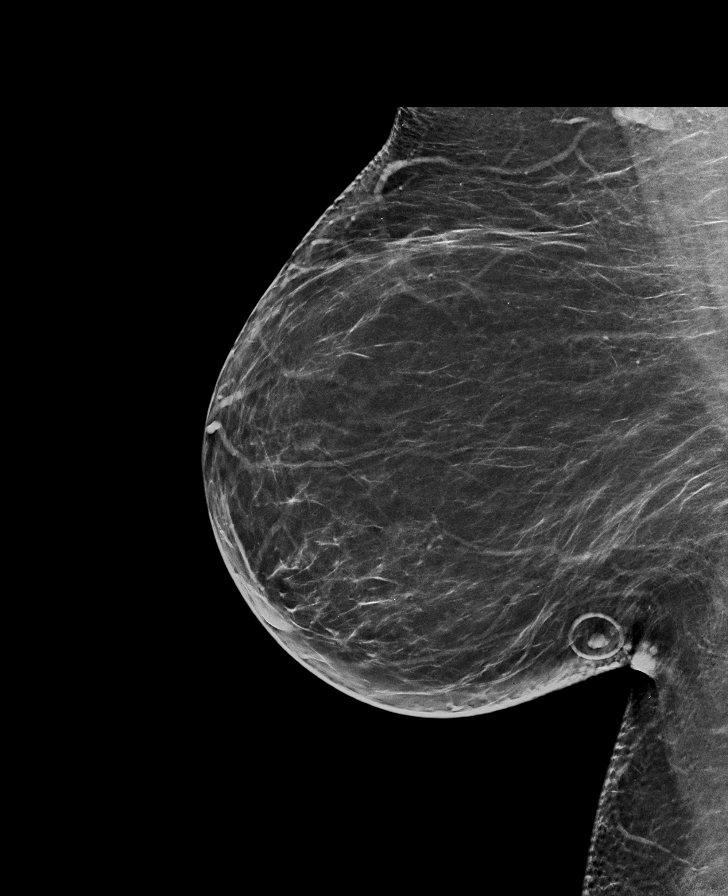

[R CC synth-2D]
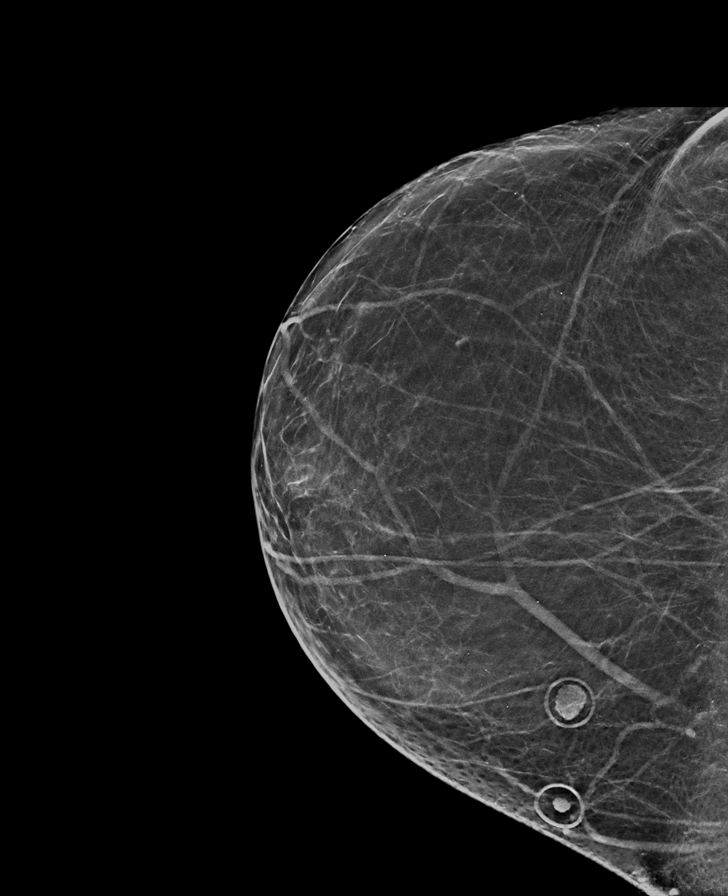

[L MLO synth-2D]
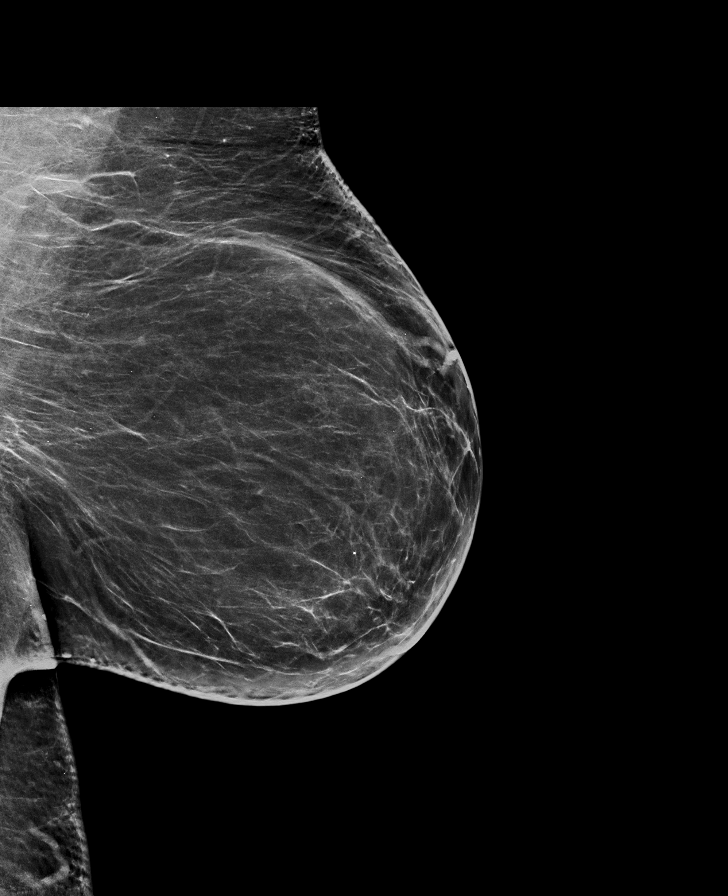

[L CC synth-2D]
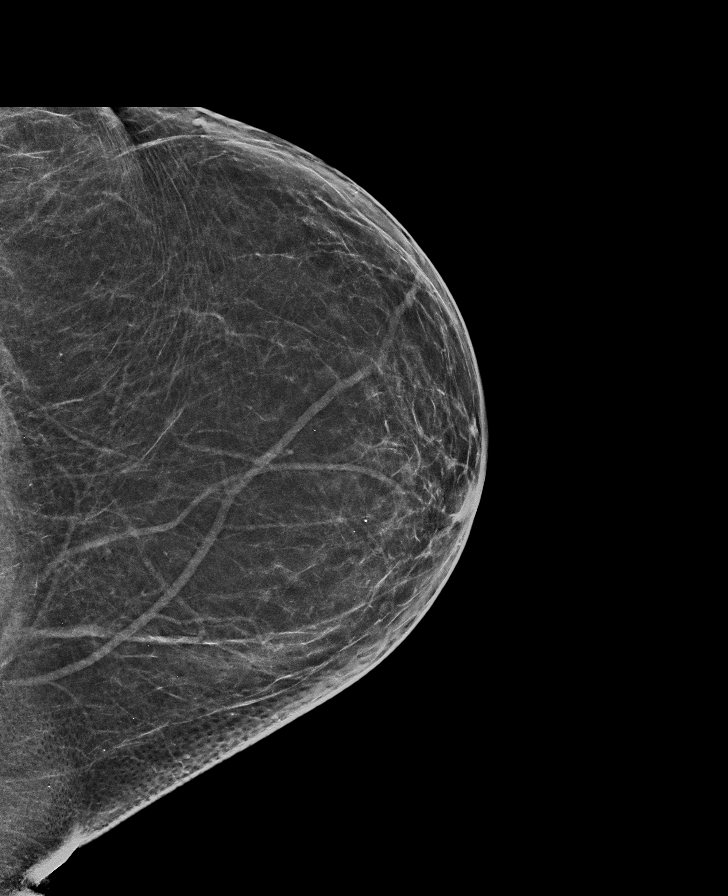

[R CC tomo · tomo slice 33/66.0]
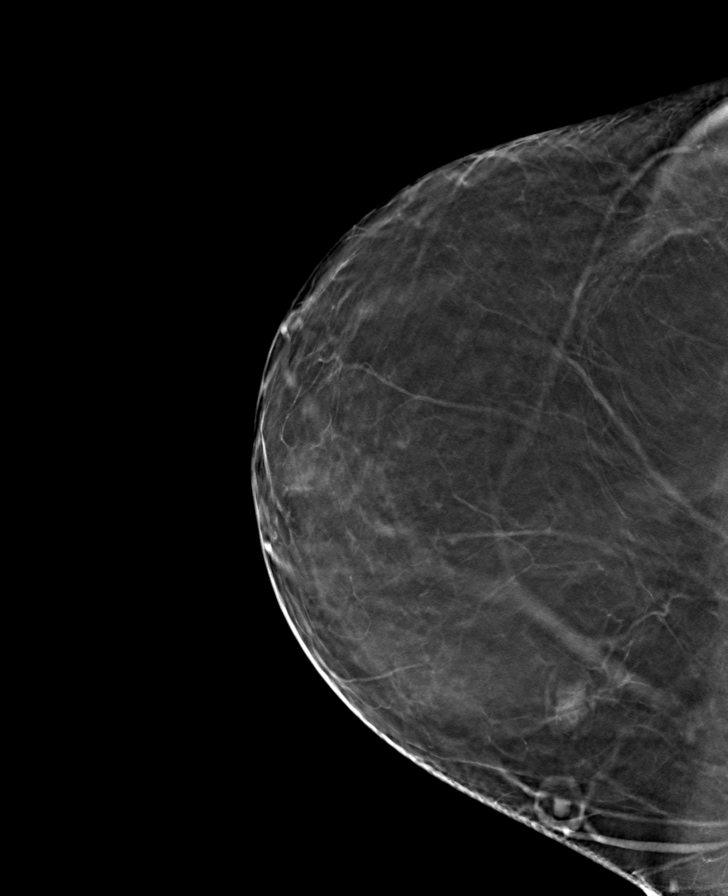

[L CC tomo · tomo slice 35/70.0]
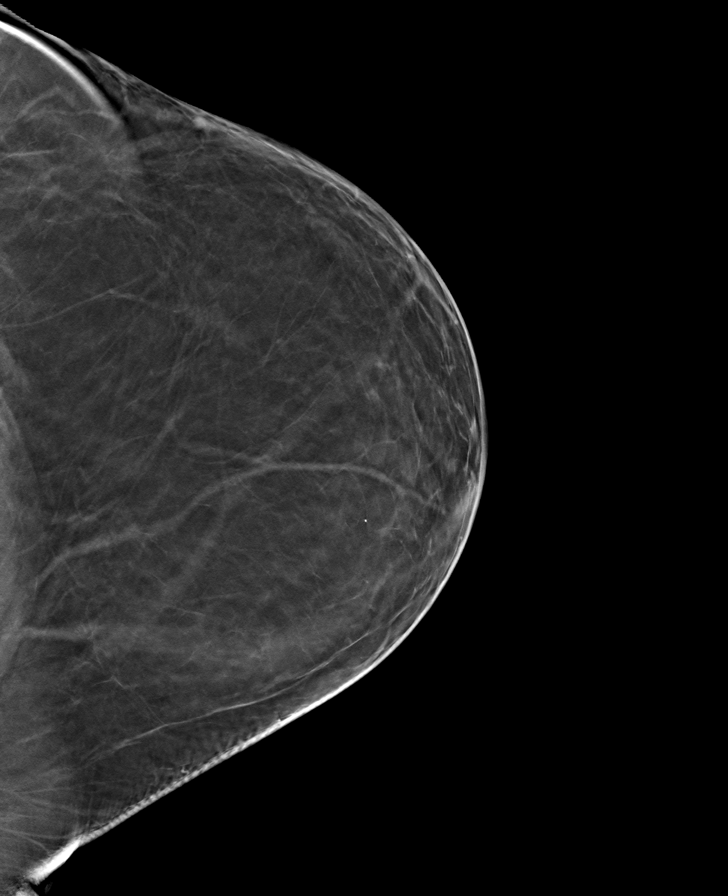

[L MLO tomo · tomo slice 41/81.0]
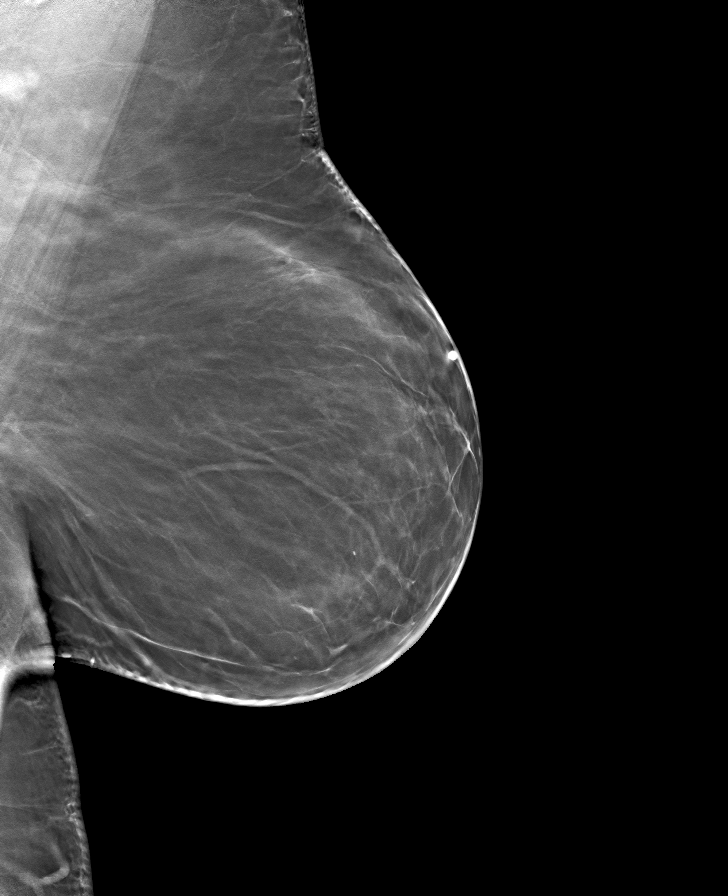

[R MLO tomo · tomo slice 41/81.0]
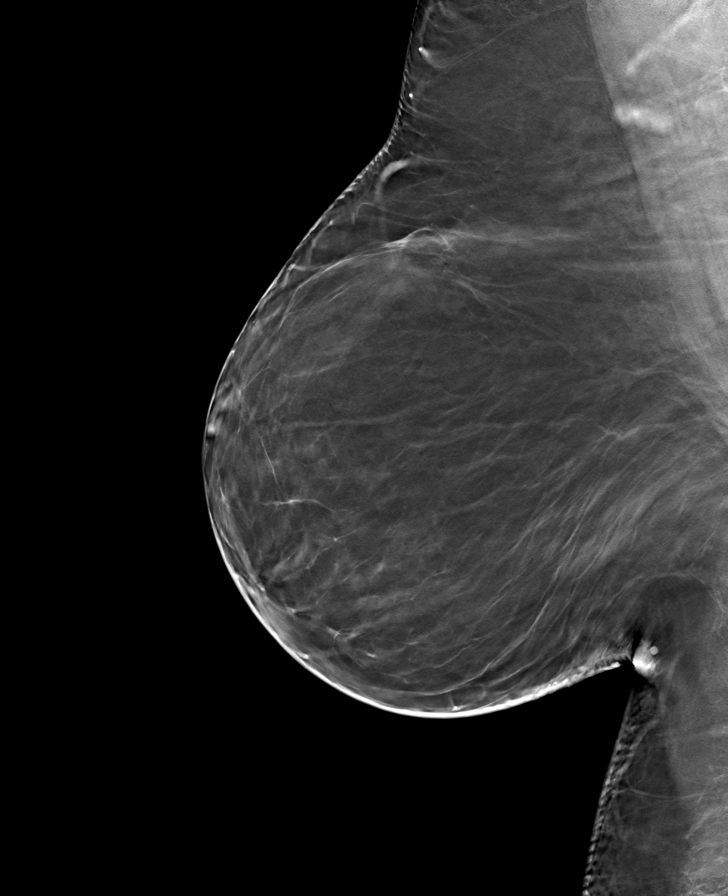

[8 of 24 positions shown; findings below may reference images not displayed]

ACR Breast Density Category b: There are scattered areas of
fibroglandular density.
FINDINGS: There are no findings suspicious for malignancy. Images were
processed with CAD.
IMPRESSION: No mammographic evidence of malignancy. A result letter of this
screening mammogram will be mailed directly to the patient.

RECOMMENDATION:
Screening mammogram in one year. (Code:[id number])

BI-RADS CATEGORY  1: Negative.

## 2021-12-13 ENCOUNTER — Ambulatory Visit
Admission: EM | Admit: 2021-12-13 | Discharge: 2021-12-13 | Disposition: A | Discharge status: Home / Self Care | Payer: Medicare Other | Attending: Family Medicine | Admitting: Family Medicine

## 2021-12-13 DIAGNOSIS — M25561 Pain in right knee: Secondary | ICD-10-CM | POA: Diagnosis not present

## 2021-12-13 MED ORDER — DEXAMETHASONE SODIUM PHOSPHATE 10 MG/ML IJ SOLN
10.0000 mg | Freq: Once | INTRAMUSCULAR | Status: AC
  Administered 2021-12-13: 10 mg via INTRAMUSCULAR

## 2021-12-13 MED ORDER — CYCLOBENZAPRINE HCL 5 MG PO TABS: 5.0000 mg | ORAL_TABLET | Freq: Three times a day (TID) | ORAL | 0 refills | Status: DC | PRN

## 2021-12-13 NOTE — ED Triage Notes (Signed)
Pt reports pain in the right knee x 1 day. States pain started when she tried to stand up yesterday. States right knee is painful when she put pressure on. Tylenol gives some relief.

## 2021-12-13 NOTE — ED Provider Notes (Signed)
[facility]    CSN: [id number] Arrival date & time: 12/13/21  7628      History   Chief Complaint Chief Complaint  Patient presents with   Knee Pain    HPI [name] is a 66 y.o. female.   Presenting today with 1 day history of sudden onset right knee pain that started when she was standing up from a seated position.  She states that her knee has been stiff, sharp pains with standing up or weightbearing but no swelling, discoloration, numbness, tingling, weakness.  Pain worsens with longer periods of sitting.  No pain when she is at rest and not weightbearing.  She states she has had chronic knee issues since she was in her 60s but has not had this type of pain before.  Trying over-the-counter pain relievers with only mild temporary relief of symptoms.    Past Medical History:  Diagnosis Date   Depression    Diabetes mellitus without complication ([place])    Hypercholesterolemia    Obesity    Obstructive sleep apnea on CPAP     Patient Active Problem List   Diagnosis Date Noted   History of colonic polyps    RECTAL BLEEDING 02/07/2010   ANAL OR RECTAL PAIN 02/07/2010   Hx of adenomatous colonic polyps 02/07/2010    Past Surgical History:  Procedure Laterality Date   ANAL FISSURE REPAIR  2012   Dr. Arnoldo Morale   COLONOSCOPY  01/29/2003   NUR: Five small polyps that were ablated/Tiny diverticula at sigmoid colon   COLONOSCOPY  03/03/2010   BTD:VVOH canal papilla tender anal canal otherwise normal   COLONOSCOPY N/A 06/21/2014   Procedure: COLONOSCOPY;  Surgeon: Daneil Dolin, MD;  [facility];  Service: Endoscopy;  Laterality: N/A;  745AM   COLONOSCOPY N/A 10/19/2020   Procedure: COLONOSCOPY;  Surgeon: Daneil Dolin, MD;  [facility];  Service: Endoscopy;  Laterality: N/A;  ASA I / II / AM procedure   INCISE AND DRAIN ABCESS     POLYPECTOMY  10/19/2020   Procedure: POLYPECTOMY;  Surgeon: Daneil Dolin, MD;  [facility];  Service: Endoscopy;;    OB History   No obstetric history on file.      Home Medications    Prior to Admission medications   Medication Sig Start Date End Date Taking? Authorizing Provider  cyclobenzaprine (FLEXERIL) 5 MG tablet Take 1 tablet (5 mg total) by mouth 3 (three) times daily as needed for muscle spasms. Do not drink alcohol or drive while taking this medication.  May cause drowsiness. 12/13/21  Yes Volney American, PA-C  acetaminophen (TYLENOL) 500 MG tablet Take 1,000 mg by mouth at bedtime as needed (pain.).    [provider]  atorvastatin (LIPITOR) 80 MG tablet Take 80 mg by mouth in the morning.    [provider]  cholecalciferol (VITAMIN D) 25 MCG (1000 UNIT) tablet Take 1,000 Units by mouth in the morning.    [provider]  citalopram (CELEXA) 40 MG tablet Take 40 mg by mouth in the morning.    [provider]  docusate sodium (COLACE) 100 MG capsule Take 200 mg by mouth every evening.    [provider]  glimepiride (AMARYL) 4 MG tablet Take 8 mg by mouth every morning. Takes 2 tablets every morning. 10/12/19   [provider]  metFORMIN (GLUCOPHAGE-XR) 500 MG 24 hr tablet Take 1,000 mg by mouth 2 (two) times  daily. 06/13/20   [provider]  pioglitazone (ACTOS) 15 MG tablet Take 15 mg by mouth in the morning.    [provider]  vitamin B-12 (CYANOCOBALAMIN) 1000 MCG tablet Take 1,000 mcg by mouth in the morning.    [provider]    Family History Family History  Problem Relation Age of Onset   Colon cancer Neg Hx     Social History Social History   Tobacco Use   Smoking status: Former    Types: Cigarettes    Quit date: 02/26/2000    Years since quitting: 21.8   Smokeless tobacco: Never  Vaping Use   Vaping Use: Never used  Substance Use Topics   Alcohol use: No    Alcohol/week: 0.0 standard drinks of alcohol   Drug use: No     Allergies    Clomipramine hcl, Elemental sulfur, and Sulfa antibiotics   Review of Systems Review of Systems Per HPI  Physical Exam Triage Vital Signs ED Triage Vitals  Enc Vitals Group     BP 12/13/21 1002 (!) 167/96     Pulse Rate 12/13/21 1002 97     Resp 12/13/21 1002 18     Temp 12/13/21 1002 98.7 F (37.1 C)     Temp Source 12/13/21 1002 Oral     SpO2 12/13/21 1002 94 %     Weight --      Height --      Head Circumference --      Peak Flow --      Pain Score 12/13/21 1001 6     Pain Loc --      Pain Edu? --      Excl. in [place]? --    No data found.  Updated Vital Signs BP (!) 167/96 (BP Location: Right Wrist)   Pulse 97   Temp 98.7 F (37.1 C) (Oral)   Resp 18   SpO2 94%   Visual Acuity Right Eye Distance:   Left Eye Distance:   Bilateral Distance:    Right Eye Near:   Left Eye Near:    Bilateral Near:     Physical Exam Vitals and nursing note reviewed.  Constitutional:      Appearance: Normal appearance. She is not ill-appearing.  HENT:     Head: Atraumatic.  Eyes:     Extraocular Movements: Extraocular movements intact.     Conjunctiva/sclera: Conjunctivae normal.  Cardiovascular:     Rate and Rhythm: Normal rate and regular rhythm.     Heart sounds: Normal heart sounds.  Pulmonary:     Effort: Pulmonary effort is normal.     Breath sounds: Normal breath sounds.  Musculoskeletal:        General: No swelling, tenderness or deformity. Normal range of motion.     Cervical back: Normal range of motion and neck supple.     Comments: When patient is in a seated position, she is able to perform active range of motion in all directions with no visualized discomfort.  Moderate pain elicited with posterior drawer testing but no obvious instability, negative McMurray's.  Mildly antalgic gait  Skin:    General: Skin is warm and dry.     Findings: No bruising or erythema.  Neurological:     Mental Status: She is alert and oriented to person, place, and time.   Psychiatric:        Mood and Affect: Mood normal.        Thought Content: Thought content  normal.        Judgment: Judgment normal.    UC Treatments / Results  Labs (all labs ordered are listed, but only abnormal results are displayed) Labs Reviewed - No data to display  EKG  Radiology No results found.  Procedures Procedures (including critical care time)  Medications Ordered in UC Medications  dexamethasone (DECADRON) injection 10 mg (10 mg Intramuscular Given 12/13/21 1020)   Initial Impression / Assessment and Plan / UC Course  I have reviewed the triage vital signs and the nursing notes.  Pertinent labs & imaging results that were available during my care of the patient were reviewed by me and considered in my medical decision making (see chart for details).     X-ray imaging deferred today with shared decision making as no tenderness to palpation, range of motion full and intact and no direct injury.  Suspect soft tissue injury, unclear etiology more specifically.  We will treat with IM Decadron, Flexeril, compression sleeve, rest.  Ortho follow-up if not resolving.  Final Clinical Impressions(s) / UC Diagnoses   Final diagnoses:  Acute pain of right knee   Discharge Instructions   None    ED Prescriptions     Medication Sig Dispense Auth. Provider   cyclobenzaprine (FLEXERIL) 5 MG tablet Take 1 tablet (5 mg total) by mouth 3 (three) times daily as needed for muscle spasms. Do not drink alcohol or drive while taking this medication.  May cause drowsiness. 15 tablet Volney American, [place]      PDMP not reviewed this encounter.   Volney American, [place] 12/13/21 1028

## 2022-04-27 ENCOUNTER — Other Ambulatory Visit (HOSPITAL_COMMUNITY): Payer: Self-pay | Admitting: Nurse Practitioner

## 2022-04-27 DIAGNOSIS — Z780 Asymptomatic menopausal state: Secondary | ICD-10-CM

## 2022-04-27 DIAGNOSIS — Z1231 Encounter for screening mammogram for malignant neoplasm of breast: Secondary | ICD-10-CM

## 2022-05-21 ENCOUNTER — Ambulatory Visit (HOSPITAL_COMMUNITY)
Admission: RE | Admit: 2022-05-21 | Discharge: 2022-05-21 | Disposition: A | Discharge status: Home / Self Care | Payer: Medicare Other | Source: Ambulatory Visit | Attending: Nurse Practitioner | Admitting: Nurse Practitioner

## 2022-05-21 DIAGNOSIS — Z1231 Encounter for screening mammogram for malignant neoplasm of breast: Secondary | ICD-10-CM | POA: Diagnosis present

## 2022-08-23 LAB — LAB REPORT - SCANNED
A1c: 7.3
EGFR: 87

## 2022-12-07 ENCOUNTER — Ambulatory Visit (INDEPENDENT_AMBULATORY_CARE_PROVIDER_SITE_OTHER): Payer: Medicare HMO | Admitting: Internal Medicine

## 2022-12-07 ENCOUNTER — Encounter: Payer: Self-pay | Admitting: Internal Medicine

## 2022-12-07 VITALS — BP 144/85 | HR 81 | Ht 67.0 in | Wt 300.0 lb

## 2022-12-07 DIAGNOSIS — Z1329 Encounter for screening for other suspected endocrine disorder: Secondary | ICD-10-CM

## 2022-12-07 DIAGNOSIS — F329 Major depressive disorder, single episode, unspecified: Secondary | ICD-10-CM | POA: Insufficient documentation

## 2022-12-07 DIAGNOSIS — Z1321 Encounter for screening for nutritional disorder: Secondary | ICD-10-CM

## 2022-12-07 DIAGNOSIS — G4733 Obstructive sleep apnea (adult) (pediatric): Secondary | ICD-10-CM

## 2022-12-07 DIAGNOSIS — E1169 Type 2 diabetes mellitus with other specified complication: Secondary | ICD-10-CM | POA: Insufficient documentation

## 2022-12-07 DIAGNOSIS — Z85820 Personal history of malignant melanoma of skin: Secondary | ICD-10-CM

## 2022-12-07 DIAGNOSIS — F3341 Major depressive disorder, recurrent, in partial remission: Secondary | ICD-10-CM

## 2022-12-07 DIAGNOSIS — E785 Hyperlipidemia, unspecified: Secondary | ICD-10-CM

## 2022-12-07 DIAGNOSIS — Z1159 Encounter for screening for other viral diseases: Secondary | ICD-10-CM

## 2022-12-07 DIAGNOSIS — E1142 Type 2 diabetes mellitus with diabetic polyneuropathy: Secondary | ICD-10-CM

## 2022-12-07 DIAGNOSIS — N920 Excessive and frequent menstruation with regular cycle: Secondary | ICD-10-CM

## 2022-12-07 DIAGNOSIS — I10 Essential (primary) hypertension: Secondary | ICD-10-CM

## 2022-12-07 DIAGNOSIS — Z87891 Personal history of nicotine dependence: Secondary | ICD-10-CM

## 2022-12-07 MED ORDER — METFORMIN HCL ER 500 MG PO TB24: 1000.0000 mg | ORAL_TABLET | Freq: Two times a day (BID) | ORAL | 1 refills | Status: DC

## 2022-12-07 MED ORDER — PIOGLITAZONE HCL 15 MG PO TABS: 15.0000 mg | ORAL_TABLET | Freq: Every morning | ORAL | 0 refills | Status: DC

## 2022-12-07 MED ORDER — GLIMEPIRIDE 4 MG PO TABS: 4.0000 mg | ORAL_TABLET | Freq: Every day | ORAL | 1 refills | Status: DC

## 2022-12-07 NOTE — Assessment & Plan Note (Signed)
She endorses nightly compliance with CPAP.  She is interested in receiving a new mask for her CPAP machine.  We will follow-up with her DME company to determine what steps are needed in order to help her acquire a new mask.

## 2022-12-07 NOTE — Progress Notes (Addendum)
New Patient Office Visit  Subjective    Patient ID: [name], female    DOB: Dec 08, 1955  Age: 67 y.o. MRN: [id number]  CC:  Chief Complaint  Patient presents with   Establish Care    HPI [name] presents to establish care.  She is a 67 year old woman with a past medical history significant for T2DM, multiple melanomas of the back, HTN, HLD, OSA on CPAP, MDD, and former tobacco use.  She was previously followed by Dr. [name].  [name] reports feeling fairly well today.  Her husband died in 2022-08-06 and she is still grieving.  Her acute concerns today are requesting assistance with obtaining a new mask for her CPAP.  She additionally requests medication refills.  She is otherwise asymptomatic and has no additional concerns to discuss.  [name] is retired from [employer].  She endorses former tobacco use, quitting in 2001.  She denies alcohol and illicit drug use.  Her family medical history is significant for hyperlipidemia, CVA, and HTN.  Acute concerns, chronic medical conditions, and outstanding preventative care and discussed today are individually addressed A/P below.   Outpatient Encounter Medications as of 12/07/2022  Medication Sig   atorvastatin (LIPITOR) 80 MG tablet Take 80 mg by mouth in the morning.   cholecalciferol (VITAMIN D) 25 MCG (1000 UNIT) tablet Take 1,000 Units by mouth in the morning.   citalopram (CELEXA) 40 MG tablet Take 40 mg by mouth in the morning.   cyclobenzaprine (FLEXERIL) 5 MG tablet Take 1 tablet (5 mg total) by mouth 3 (three) times daily as needed for muscle spasms. Do not drink alcohol or drive while taking this medication.  May cause drowsiness.   docusate sodium (COLACE) 100 MG capsule Take 200 mg by mouth every evening.   vitamin B-12 (CYANOCOBALAMIN) 1000 MCG tablet Take 1,000 mcg by mouth in the morning.   [DISCONTINUED] glimepiride (AMARYL) 4 MG tablet Take 8 mg by mouth every morning. Takes 2 tablets every  morning.   [DISCONTINUED] metFORMIN (GLUCOPHAGE-XR) 500 MG 24 hr tablet Take 1,000 mg by mouth 2 (two) times daily.   [DISCONTINUED] pioglitazone (ACTOS) 15 MG tablet Take 15 mg by mouth in the morning.   glimepiride (AMARYL) 4 MG tablet Take 1 tablet (4 mg total) by mouth daily with breakfast. Takes 2 tablets every morning.   metFORMIN (GLUCOPHAGE-XR) 500 MG 24 hr tablet Take 2 tablets (1,000 mg total) by mouth 2 (two) times daily.   pioglitazone (ACTOS) 15 MG tablet Take 1 tablet (15 mg total) by mouth in the morning.   [DISCONTINUED] acetaminophen (TYLENOL) 500 MG tablet Take 1,000 mg by mouth at bedtime as needed (pain.).   No facility-administered encounter medications on file as of 12/07/2022.    Past Medical History:  Diagnosis Date   Depression    Diabetes mellitus without complication (HCC)    Hypercholesterolemia    Obesity    Obstructive sleep apnea on CPAP     Past Surgical History:  Procedure Laterality Date   ANAL FISSURE REPAIR  2012   Dr. Lovell Sheehan   COLONOSCOPY  01/29/2003   NUR: Five small polyps that were ablated/Tiny diverticula at sigmoid colon   COLONOSCOPY  03/03/2010   XLK:GMWN canal papilla tender anal canal otherwise normal   COLONOSCOPY N/A 06/21/2014   Procedure: COLONOSCOPY;  Surgeon: Corbin Ade, MD;  [facility];  Service: Endoscopy;  Laterality: N/A;  745AM   COLONOSCOPY N/A 10/19/2020   Procedure: COLONOSCOPY;  Surgeon: Corbin Ade, MD;  [facility];  Service: Endoscopy;  Laterality: N/A;  ASA I / II / AM procedure   INCISE AND DRAIN ABCESS     POLYPECTOMY  10/19/2020   Procedure: POLYPECTOMY;  Surgeon: Corbin Ade, MD;  [facility];  Service: Endoscopy;;    Family History  Problem Relation Age of Onset   Colon cancer Neg Hx     Social History   Socioeconomic History   Marital status: Married    Spouse name: Not on file   Number of children: Not on file   Years of education: Not on file    Highest education level: Not on file  Occupational History   Not on file  Tobacco Use   Smoking status: Former    Types: Cigarettes    Quit date: 02/26/2000    Years since quitting: 22.7   Smokeless tobacco: Never  Vaping Use   Vaping Use: Never used  Substance and Sexual Activity   Alcohol use: No    Alcohol/week: 0.0 standard drinks of alcohol   Drug use: No   Sexual activity: Not on file  Other Topics Concern   Not on file  Social History Narrative   Not on file   Social Determinants of Health   Financial Resource Strain: Not on file  Food Insecurity: Not on file  Transportation Needs: Not on file  Physical Activity: Not on file  Stress: Not on file  Social Connections: Not on file  Intimate Partner Violence: Not on file    Review of Systems  Constitutional:  Negative for chills and fever.  HENT:  Negative for sore throat.   Respiratory:  Negative for cough and shortness of breath.   Cardiovascular:  Negative for chest pain, palpitations and leg swelling.  Gastrointestinal:  Negative for abdominal pain, blood in stool, constipation, diarrhea, nausea and vomiting.  Genitourinary:  Negative for dysuria and hematuria.  Musculoskeletal:  Negative for myalgias.  Skin:  Negative for itching and rash.  Neurological:  Negative for dizziness and headaches.  Psychiatric/Behavioral:  Negative for depression and suicidal ideas.    Objective    BP (!) 144/85   Pulse 81   Ht 5\' 7"  (1.702 m)   Wt 300 lb (136.1 kg)   SpO2 94%   BMI 46.99 kg/m   Physical Exam Vitals reviewed.  Constitutional:      General: She is not in acute distress.    Appearance: Normal appearance. She is obese. She is not toxic-appearing.  HENT:     Head: Normocephalic and atraumatic.     Right Ear: External ear normal.     Left Ear: External ear normal.     Nose: Nose normal. No congestion or rhinorrhea.     Mouth/Throat:     Mouth: Mucous membranes are moist.     Pharynx: Oropharynx is clear.  No oropharyngeal exudate or posterior oropharyngeal erythema.  Eyes:     General: No scleral icterus.    Extraocular Movements: Extraocular movements intact.     Conjunctiva/sclera: Conjunctivae normal.     Pupils: Pupils are equal, round, and reactive to light.  Cardiovascular:     Rate and Rhythm: Normal rate and regular rhythm.     Pulses: Normal pulses.     Heart sounds: Normal heart sounds. No murmur heard.    No friction rub. No gallop.  Pulmonary:     Effort: Pulmonary effort is normal.     Breath sounds:  Normal breath sounds. No wheezing, rhonchi or rales.  Abdominal:     General: Abdomen is flat. Bowel sounds are normal. There is no distension.     Palpations: Abdomen is soft.     Tenderness: There is no abdominal tenderness.  Musculoskeletal:        General: No swelling. Normal range of motion.     Cervical back: Normal range of motion.     Right lower leg: No edema.     Left lower leg: No edema.  Lymphadenopathy:     Cervical: No cervical adenopathy.  Skin:    General: Skin is warm and dry.     Capillary Refill: Capillary refill takes less than 2 seconds.     Coloration: Skin is not jaundiced.     Findings: Lesion (Large lipoma over posterior neck) present.     Comments: 2 x 2 cm circumferential wound over left shoulder blade  Neurological:     General: No focal deficit present.     Mental Status: She is alert and oriented to person, place, and time.  Psychiatric:        Mood and Affect: Mood normal.        Behavior: Behavior normal.    Assessment & Plan:   Problem List Items Addressed This Visit       Essential hypertension - Primary    Currently prescribed lisinopril 30 mg daily and HCTZ 12.5 mg daily.  BP is adequately controlled based on home readings.      OSA on CPAP    She endorses nightly compliance with CPAP.  She is interested in receiving a new mask and supplies for her CPAP machine.  We will follow-up with her DME company to determine what steps  are needed in order to help her acquire a new mask and additional CPAP supplies.      Type 2 diabetes mellitus with diabetic polyneuropathy (HCC)    Previously documented history of T2DM complicated by diabetic polyneuropathy.  She reports that her last A1c was 7.3.  She is currently prescribed metformin 1000 mg twice daily, glimepiride 4 mg daily, and Actos 15 mg daily.  She states that glimepiride was recently reduced to 4 mg daily from 8 mg daily due to hypoglycemia. -No medication changes today -Repeat A1c and urine microalbumin/creatinine ratio ordered. -I discussed with Ms. Pusateri that we will consider discontinuing glimepiride is at goal due to concern for hypoglycemia      Hyperlipidemia associated with type 2 diabetes mellitus (HCC)    She is currently prescribed atorvastatin 80 mg daily.  Repeat lipid panel ordered today.      History of melanoma    She endorses a history of 2 melanoma lesions on her back that have been excised by dermatology (Dr. Margo Aye).  She will return to care this summer for further evaluation.  On exam today there is a circumferential wound over the left shoulder blade at the site of recent melanoma excision.      MDD (major depressive disorder)    History of MDD that is typically well-controlled with citalopram 40 mg daily.  PHQ-9 score is elevated today at 10, which is attributed to grieving the recent death of her husband.  She denies SI/HI. -No medication changes today      Spotting    She endorses a recent history of vaginal spotting.  She will see gynecology next month.      Return in about 3 months (around 03/09/2023).   Aneta Mins  Derrell Lolling, MD

## 2022-12-07 NOTE — Assessment & Plan Note (Signed)
Previously documented history of T2DM complicated by diabetic polyneuropathy.  She reports that her last A1c was 7.3.  She is currently prescribed metformin 1000 mg twice daily, glimepiride 4 mg daily, and Actos 15 mg daily.  She states that glimepiride was recently reduced to 4 mg daily from 8 mg daily due to hypoglycemia. -No medication changes today -Repeat A1c and urine microalbumin/creatinine ratio ordered. -I discussed with [name] that we will consider discontinuing glimepiride is at goal due to concern for hypoglycemia

## 2022-12-07 NOTE — Assessment & Plan Note (Signed)
She endorses a history of 2 melanoma lesions on her back that have been excised by dermatology (Dr. Margo Aye).  She will return to care this summer for further evaluation.  On exam today there is a circumferential wound over the left shoulder blade at the site of recent melanoma excision.

## 2022-12-07 NOTE — Patient Instructions (Signed)
It was a pleasure to see you today.  Thank you for giving [place] the opportunity to be involved in your care.  Below is a brief recap of your visit and next steps.  We will plan to see you again in 3 months.  Summary You have established care today We will check basic labs Medications refilled Follow up in 3 months

## 2022-12-07 NOTE — Assessment & Plan Note (Signed)
She is currently prescribed atorvastatin 80 mg daily.  Repeat lipid panel ordered today.

## 2022-12-07 NOTE — Assessment & Plan Note (Signed)
History of MDD that is typically well-controlled with citalopram 40 mg daily.  PHQ-9 score is elevated today at 10, which is attributed to grieving the recent death of her husband.  She denies SI/HI. -No medication changes today

## 2022-12-07 NOTE — Assessment & Plan Note (Signed)
She endorses a recent history of vaginal spotting.  She will see gynecology next month.

## 2022-12-07 NOTE — Assessment & Plan Note (Signed)
Currently prescribed lisinopril 30 mg daily and HCTZ 12.5 mg daily.  BP is adequately controlled based on home readings.

## 2022-12-09 LAB — CMP14+EGFR
ALT: 12 IU/L (ref 0–32)
AST: 10 IU/L (ref 0–40)
Albumin/Globulin Ratio: 1.9
Albumin: 4.1 g/dL (ref 3.9–4.9)
Alkaline Phosphatase: 72 IU/L (ref 44–121)
BUN/Creatinine Ratio: 15 (ref 12–28)
BUN: 12 mg/dL (ref 8–27)
Bilirubin Total: 0.3 mg/dL (ref 0.0–1.2)
CO2: 22 mmol/L (ref 20–29)
Calcium: 9.3 mg/dL (ref 8.7–10.3)
Chloride: 105 mmol/L (ref 96–106)
Creatinine, Ser: 0.78 mg/dL (ref 0.57–1.00)
Globulin, Total: 2.2 g/dL (ref 1.5–4.5)
Glucose: 155 mg/dL — ABNORMAL HIGH (ref 70–99)
Potassium: 4 mmol/L (ref 3.5–5.2)
Sodium: 142 mmol/L (ref 134–144)
Total Protein: 6.3 g/dL (ref 6.0–8.5)
eGFR: 83 mL/min/{1.73_m2} (ref >59)

## 2022-12-09 LAB — HEMOGLOBIN A1C
Est. average glucose Bld gHb Est-mCnc: 166 mg/dL
Hgb A1c MFr Bld: 7.4 % — ABNORMAL HIGH (ref 4.8–5.6)

## 2022-12-09 LAB — B12 AND FOLATE PANEL
Folate: 12.3 ng/mL (ref >3.0)
Vitamin B-12: 808 pg/mL (ref 232–1245)

## 2022-12-09 LAB — CBC WITH DIFFERENTIAL/PLATELET
Basophils Absolute: 0 10*3/uL (ref 0.0–0.2)
Basos: 0 %
EOS (ABSOLUTE): 0.1 10*3/uL (ref 0.0–0.4)
Eos: 1 %
Hematocrit: 40.6 % (ref 34.0–46.6)
Hemoglobin: 13.1 g/dL (ref 11.1–15.9)
Immature Grans (Abs): 0 10*3/uL (ref 0.0–0.1)
Immature Granulocytes: 0 %
Lymphocytes Absolute: 1.8 10*3/uL (ref 0.7–3.1)
Lymphs: 33 %
MCH: 29.6 pg (ref 26.6–33.0)
MCHC: 32.3 g/dL (ref 31.5–35.7)
MCV: 92 fL (ref 79–97)
Monocytes Absolute: 0.4 10*3/uL (ref 0.1–0.9)
Monocytes: 7 %
Neutrophils Absolute: 3.2 10*3/uL (ref 1.4–7.0)
Neutrophils: 59 %
Platelets: 233 10*3/uL (ref 150–450)
RBC: 4.42 x10E6/uL (ref 3.77–5.28)
RDW: 13.6 % (ref 11.7–15.4)
WBC: 5.5 10*3/uL (ref 3.4–10.8)

## 2022-12-09 LAB — HCV INTERPRETATION

## 2022-12-09 LAB — TSH+FREE T4
Free T4: 1.14 ng/dL (ref 0.82–1.77)
TSH: 2.11 u[IU]/mL (ref 0.450–4.500)

## 2022-12-09 LAB — MICROALBUMIN / CREATININE URINE RATIO
Creatinine, Urine: 92.8 mg/dL
Microalb/Creat Ratio: 6 mg/g creat (ref 0–29)
Microalbumin, Urine: 6 ug/mL

## 2022-12-09 LAB — LIPID PANEL
Chol/HDL Ratio: 3.1 ratio (ref 0.0–4.4)
Cholesterol, Total: 187 mg/dL (ref 100–199)
HDL: 60 mg/dL (ref >39)
LDL Chol Calc (NIH): 108 mg/dL — ABNORMAL HIGH (ref 0–99)
Triglycerides: 105 mg/dL (ref 0–149)
VLDL Cholesterol Cal: 19 mg/dL (ref 5–40)

## 2022-12-09 LAB — VITAMIN D 25 HYDROXY (VIT D DEFICIENCY, FRACTURES): Vit D, 25-Hydroxy: 31.7 ng/mL (ref 30.0–100.0)

## 2022-12-09 LAB — HCV AB W REFLEX TO QUANT PCR: HCV Ab: NONREACTIVE

## 2022-12-10 ENCOUNTER — Telehealth: Payer: Self-pay | Admitting: Internal Medicine

## 2022-12-10 NOTE — Telephone Encounter (Signed)
Patient called needs prior authorization for supplies for the cpap supplies sent to [facility] fax # [phone]

## 2022-12-11 NOTE — Telephone Encounter (Signed)
Sent to [employer]  

## 2023-01-01 ENCOUNTER — Encounter: Payer: Self-pay | Admitting: Adult Health

## 2023-01-01 ENCOUNTER — Other Ambulatory Visit (HOSPITAL_COMMUNITY)
Admission: RE | Admit: 2023-01-01 | Discharge: 2023-01-01 | Disposition: A | Discharge status: Home / Self Care | Payer: Medicare HMO | Source: Ambulatory Visit | Attending: Adult Health | Admitting: Adult Health

## 2023-01-01 ENCOUNTER — Ambulatory Visit (INDEPENDENT_AMBULATORY_CARE_PROVIDER_SITE_OTHER): Payer: Medicare HMO | Admitting: Adult Health

## 2023-01-01 VITALS — BP 140/85 | HR 75 | Ht 67.0 in | Wt 295.5 lb

## 2023-01-01 DIAGNOSIS — N950 Postmenopausal bleeding: Secondary | ICD-10-CM

## 2023-01-01 DIAGNOSIS — Z1151 Encounter for screening for human papillomavirus (HPV): Secondary | ICD-10-CM | POA: Diagnosis not present

## 2023-01-01 DIAGNOSIS — Z124 Encounter for screening for malignant neoplasm of cervix: Secondary | ICD-10-CM | POA: Diagnosis not present

## 2023-01-01 DIAGNOSIS — Z01419 Encounter for gynecological examination (general) (routine) without abnormal findings: Secondary | ICD-10-CM | POA: Insufficient documentation

## 2023-01-01 NOTE — Progress Notes (Signed)
Subjective:     Patient ID: [name], female   DOB: Apr 19, 1956, 67 y.o.   MRN: [id number]  HPI [name] is a 67 year old white female, widowed, PM, in complaining of vaginal spotting for over a year, but was heavy for about a month after pushing lawn mower, but it has stopped, denies any pain.  PCP is Dr Durwin Nora.  Review of Systems +vaginal bleeding Denies any pain Not having sex Reviewed past medical,surgical, social and family history. Reviewed medications and allergies.     Objective:   Physical Exam BP (!) 140/85 (BP Location: Right Arm, Patient Position: Sitting, Cuff Size: Large)   Pulse 75   Ht 5\' 7"  (1.702 m)   Wt 295 lb 8 oz (134 kg)   BMI 46.28 kg/m   Skin warm and dry. Lungs: clear to ausculation bilaterally. Cardiovascular: regular rate and rhythm.    Pelvic: external genitalia is normal in appearance no lesions, vagina:pale,urethra has no lesions or masses noted, cervix:smooth with stenotic os, pap with HR HPV genotyping performed, uterus: normal size, shape and contour, non tender, no masses felt, adnexa: no masses or tenderness noted. Bladder is non tender and no masses felt.  AA is 0 Fall risk is moderate    01/01/2023    9:56 AM 12/07/2022    9:02 AM 11/21/2016    4:40 PM  Depression screen PHQ 2/9  Decreased Interest 1 3   Down, Depressed, Hopeless 1 2   PHQ - 2 Score 2 5   Altered sleeping 1 1   Tired, decreased energy 0 1   Change in appetite 0 0   Feeling bad or failure about yourself  1 1   Trouble concentrating 1 1   Moving slowly or fidgety/restless 1 1   Suicidal thoughts 0 0   PHQ-9 Score 6 10      Information is confidential and restricted. Go to Review Flowsheets to unlock data.       01/01/2023    9:56 AM 12/07/2022    9:03 AM  GAD 7 : Generalized Anxiety Score  Nervous, Anxious, on Edge 1 1  Control/stop worrying 1 2  Worry too much - different things 1 1  Trouble relaxing 0 0  Restless 0 0  Easily annoyed or irritable 0 1  Afraid -  awful might happen 1 1  Total GAD 7 Score 4 6      Upstream - 01/01/23 1013       Pregnancy Intention Screening   Does the patient want to become pregnant in the next year? N/A    Does the patient's partner want to become pregnant in the next year? N/A    Would the patient like to discuss contraceptive options today? N/A      Contraception Wrap Up   Current Method Abstinence   PM   End Method Abstinence   PM   Contraception Counseling Provided No            Examination chaperoned by Malachy Mood LPN  Assessment:     1. PMB (postmenopausal bleeding) Has had spotting for over a year Had heavy bleeding for a month, after pushing lawn mower,has stopped Pelvic US scheduled for 01/09/23 at 2:30 pm at [facility] to assess uterus and ovaries Pt aware that if endometrium is thickened will need endometrial biopsy to rule out uterine cancer - US PELVIC COMPLETE WITH TRANSVAGINAL; Future  2. Routine Papanicolaou smear Pap sent - Cytology - PAP( [place])  Plan:     Follow up TBD

## 2023-01-03 ENCOUNTER — Telehealth: Payer: Self-pay | Admitting: *Deleted

## 2023-01-03 LAB — CYTOLOGY - PAP
Adequacy: ABSENT
Comment: NEGATIVE
Diagnosis: NEGATIVE
High risk HPV: NEGATIVE

## 2023-01-03 NOTE — Telephone Encounter (Signed)
-----   Message from Cyril Mourning sent at 01/03/2023  1:19 PM EDT ----- Will you let Halleigh know pap was negative. THX

## 2023-01-03 NOTE — Telephone Encounter (Signed)
Pt aware pap was negative. Pt voiced understanding. JSY 

## 2023-01-09 ENCOUNTER — Ambulatory Visit (HOSPITAL_COMMUNITY)
Admission: RE | Admit: 2023-01-09 | Discharge: 2023-01-09 | Disposition: A | Discharge status: Home / Self Care | Payer: Medicare HMO | Source: Ambulatory Visit | Attending: Adult Health | Admitting: Adult Health

## 2023-01-09 DIAGNOSIS — N950 Postmenopausal bleeding: Secondary | ICD-10-CM | POA: Diagnosis present

## 2023-01-11 ENCOUNTER — Telehealth: Payer: Self-pay | Admitting: Adult Health

## 2023-01-11 NOTE — Telephone Encounter (Signed)
Pt aware of [place] and need to get endometrial biopsy. Call Monday for appt.

## 2023-02-01 ENCOUNTER — Encounter: Payer: Self-pay | Admitting: Obstetrics & Gynecology

## 2023-02-01 ENCOUNTER — Other Ambulatory Visit (HOSPITAL_COMMUNITY)
Admission: RE | Admit: 2023-02-01 | Discharge: 2023-02-01 | Disposition: A | Discharge status: Home / Self Care | Payer: Medicare HMO | Source: Ambulatory Visit | Attending: Obstetrics & Gynecology | Admitting: Obstetrics & Gynecology

## 2023-02-01 ENCOUNTER — Ambulatory Visit: Payer: Medicare HMO | Admitting: Obstetrics & Gynecology

## 2023-02-01 VITALS — BP 147/74 | HR 74 | Wt 249.0 lb

## 2023-02-01 DIAGNOSIS — E669 Obesity, unspecified: Secondary | ICD-10-CM

## 2023-02-01 DIAGNOSIS — Z6839 Body mass index (BMI) 39.0-39.9, adult: Secondary | ICD-10-CM | POA: Diagnosis not present

## 2023-02-01 DIAGNOSIS — N950 Postmenopausal bleeding: Secondary | ICD-10-CM | POA: Insufficient documentation

## 2023-02-01 NOTE — Progress Notes (Signed)
GYN VISIT Patient name: [name] MRN [id number]  Date of birth: June 21, 1956 Chief Complaint:   Endometrial Biopsy  History of Present Illness:   MARCHIA ROBER is a 67 y.o. G1P0010 PM female being seen today for the following concerns:     PMB: Had it for a while- on occasion would see it when she wiped or strained.  At least a year or more.  Noting slight mucous-pink discharge.  Denies pelvic pain.  Reports no other acute gyn concerns.  No LMP recorded. Patient is postmenopausal.  [place] 12/2022: 8.6 x 4.4 x 5.4 cm = volume: 106 mL. The uterus is anteverted. The cervix is unremarkable. Within the lower uterine segment abutting the internal cervical os is a heterogeneously hyperechoic mass demonstrating scattered dense shadowing measuring 3.2 x 2.8 x 2.8 cm. The mass significant internal vascularity, best appreciated on image # [id number] and # 74. While this most likely represents a submucosal or potentially intracavitary fibroid, the degree of vascularity is unusual in a patient of this age and sarcomatous degeneration should be considered.    Review of Systems:   Pertinent items are noted in HPI Denies fever/chills, dizziness, headaches, visual disturbances, fatigue, shortness of breath, chest pain, abdominal pain, vomiting.  Pertinent History Reviewed:   Past Surgical History:  Procedure Laterality Date   ANAL FISSURE REPAIR  06/25/2010   Dr. Lovell Sheehan   COLONOSCOPY  01/29/2003   NUR: Five small polyps that were ablated/Tiny diverticula at sigmoid colon   COLONOSCOPY  03/03/2010   WUJ:WJXB canal papilla tender anal canal otherwise normal   COLONOSCOPY N/A 06/21/2014   Procedure: COLONOSCOPY;  Surgeon: Corbin Ade, MD;  [facility];  Service: Endoscopy;  Laterality: N/A;  745AM   COLONOSCOPY N/A 10/19/2020   Procedure: COLONOSCOPY;  Surgeon: Corbin Ade, MD;  [facility];  Service: Endoscopy;  Laterality: N/A;  ASA I / II / AM procedure   INCISE  AND DRAIN ABCESS     melonoma removed     POLYPECTOMY  10/19/2020   Procedure: POLYPECTOMY;  Surgeon: Corbin Ade, MD;  [facility];  Service: Endoscopy;;    Past Medical History:  Diagnosis Date   Depression    Diabetes mellitus without complication (HCC)    Hypercholesterolemia    Melanoma (HCC)    Obesity    Obstructive sleep apnea on CPAP    Reviewed problem list, medications and allergies. Physical Assessment:   Vitals:   02/01/23 1017  BP: (!) 147/74  Pulse: 74  Weight: 249 lb (112.9 kg)  Body mass index is 39 kg/m.       Physical Examination:   General appearance: alert, well appearing, and in no distress  Psych: mood appropriate, normal affect  Skin: warm & dry   Cardiovascular: normal heart rate noted  Respiratory: normal respiratory effort, no distress  Abdomen: obese, soft, non-tender   Pelvic: VULVA: normal appearing vulva with no masses, tenderness or lesions, VAGINA: normal appearing vagina with normal color and discharge, no lesions, CERVIX: normal appearing cervix without discharge or lesions  Extremities: no edema   Chaperone:  Freddie Apley     Endometrial Biopsy Procedure Note  Pre-operative Diagnosis: PMB  Post-operative Diagnosis: same  Procedure Details  The risks (including infection, bleeding, pain, and uterine perforation) and benefits of the procedure were explained to the patient and Written informed consent was obtained.  Antibiotic prophylaxis against endocarditis was not indicated.   The patient was placed  in the dorsal lithotomy position.  Bimanual exam showed the uterus to be in the neutral position.  A speculum inserted in the vagina, and the cervix prepped with betadine.     A single tooth tenaculum was applied to the anterior lip of the cervix for stabilization.  Os finder was used.  A Pipelle endometrial aspirator was used to sample the endometrium.  Sample was sent for pathologic  examination.  Condition: Stable  Complications: None   Assessment & Plan:  1) PMB   -Reviewed potential diagnosis ranging from vaginal atrophy to endometrial cancer -Discussed ultrasound findings where endometrium could not be well observed and recommendation for EMB for further management -Informed consent obtained, procedure as above -Next step pending pathology results The patient was advised to call for any fever or for prolonged or severe pain or bleeding. She was advised to use OTC analgesics as needed for mild to moderate pain. She was advised to avoid vaginal intercourse for 48 hours or until the bleeding has completely stopped.   No orders of the defined types were placed in this encounter.   Return for TBD.   [name], DO Attending Obstetrician & Gynecologist, [facility] for [employer], [facility]

## 2023-02-05 ENCOUNTER — Other Ambulatory Visit: Payer: Self-pay | Admitting: Obstetrics & Gynecology

## 2023-02-05 DIAGNOSIS — N9489 Other specified conditions associated with female genital organs and menstrual cycle: Secondary | ICD-10-CM

## 2023-02-06 ENCOUNTER — Ambulatory Visit (INDEPENDENT_AMBULATORY_CARE_PROVIDER_SITE_OTHER): Payer: Medicare HMO

## 2023-02-06 ENCOUNTER — Ambulatory Visit (INDEPENDENT_AMBULATORY_CARE_PROVIDER_SITE_OTHER): Payer: Medicare HMO | Admitting: Obstetrics & Gynecology

## 2023-02-06 ENCOUNTER — Other Ambulatory Visit (HOSPITAL_COMMUNITY)
Admission: RE | Admit: 2023-02-06 | Discharge: 2023-02-06 | Disposition: A | Discharge status: Home / Self Care | Payer: Medicare HMO | Source: Ambulatory Visit | Attending: Obstetrics & Gynecology | Admitting: Obstetrics & Gynecology

## 2023-02-06 DIAGNOSIS — Z6839 Body mass index (BMI) 39.0-39.9, adult: Secondary | ICD-10-CM

## 2023-02-06 DIAGNOSIS — E669 Obesity, unspecified: Secondary | ICD-10-CM | POA: Diagnosis not present

## 2023-02-06 DIAGNOSIS — N9489 Other specified conditions associated with female genital organs and menstrual cycle: Secondary | ICD-10-CM | POA: Diagnosis not present

## 2023-02-06 DIAGNOSIS — N950 Postmenopausal bleeding: Secondary | ICD-10-CM | POA: Diagnosis not present

## 2023-02-06 NOTE — Progress Notes (Signed)
   GYN VISIT Patient name: [name] MRN [id number]  Date of birth: [birth year] Chief Complaint:   No chief complaint on file.  History of Present Illness:   [name] is a 67 y.o. G81P0010 female being seen today for PMB follow up  Recent office visit was completed for EMB.  Based on the initial [place], it was possible that the uterine fibroids was intra-cavitary.  Pt presents today for [facility] for further evaluation.  She notes no changes since her last visit, she continues to have BRB.      No LMP recorded. Patient is postmenopausal.    Review of Systems:   Pertinent items are noted in HPI Denies fever/chills, dizziness, headaches, visual disturbances, fatigue, shortness of breath, chest pain.  Pertinent History Reviewed:   Past Surgical History:  Procedure Laterality Date   ANAL FISSURE REPAIR  06/25/2010   Dr. Lovell Sheehan   COLONOSCOPY  01/29/2003   NUR: Five small polyps that were ablated/Tiny diverticula at sigmoid colon   COLONOSCOPY  03/03/2010   WUJ:WJXB canal papilla tender anal canal otherwise normal   COLONOSCOPY N/A 06/21/2014   Procedure: COLONOSCOPY;  Surgeon: Corbin Ade, MD;  [facility];  Service: Endoscopy;  Laterality: N/A;  745AM   COLONOSCOPY N/A 10/19/2020   Procedure: COLONOSCOPY;  Surgeon: Corbin Ade, MD;  [facility];  Service: Endoscopy;  Laterality: N/A;  ASA I / II / AM procedure   INCISE AND DRAIN ABCESS     melonoma removed     POLYPECTOMY  10/19/2020   Procedure: POLYPECTOMY;  Surgeon: Corbin Ade, MD;  [facility];  Service: Endoscopy;;    Past Medical History:  Diagnosis Date   Depression    Diabetes mellitus without complication (HCC)    Hypercholesterolemia    Melanoma (HCC)    Obesity    Obstructive sleep apnea on CPAP    Reviewed problem list, medications and allergies. Physical Assessment:  There were no vitals filed for this visit.There is no height or weight on file to calculate  BMI.       Physical Examination:   General appearance: alert, well appearing, and in no distress  Psych: mood appropriate, normal affect  Skin: warm & dry   Cardiovascular: normal heart rate noted  Respiratory: normal respiratory effort, no distress  Abdomen: soft, non-tender   Pelvic: VULVA: normal appearing vulva with no masses, tenderness or lesions, VAGINA: normal appearing vagina with normal color and discharge, no lesions, CERVIX: normal appearing cervix without discharge or lesions, BRB noted from cervical os.  See [facility] note regarding procedure.  Due to significant bleeding noted, repeat EMB obtained today.  Extremities: no edema    Chaperone: Faith Rogue    Assessment & Plan:  1) Postmenopausal bleeding   -SHG completed -based on amount of bleeding and likely intracavitary location of fibroid, discussed with pt that surgical intervention will be needed -discussed potential for Robotic-assisted laparoscopic hysterectomy and bilateral salpingo-oophorectomy -await results of EMB for next step  No orders of the defined types were placed in this encounter.    [name], DO Attending Obstetrician & Gynecologist, [facility] for [employer], [facility]

## 2023-02-06 NOTE — Progress Notes (Signed)
TV SONOHYSTEROGRAM: Heterogenous anteverted uterus,vascular echogenic heterogenous endometrial mass 3.2 x 2.8 x 2.9 cm,bilateral enlarged ovaries,no free fluid  Sonohysterogram was preformed by Dr.Ozan with ultrasound guidance and surveillance throughout the procedure. Saline was installed into the endometrial cavity and outlined the posterior side of the 3.2 x 2.8 x 2.9 cm complex echogenic endometrial mass.

## 2023-02-08 LAB — SURGICAL PATHOLOGY

## 2023-02-11 ENCOUNTER — Other Ambulatory Visit: Payer: Self-pay | Admitting: Obstetrics & Gynecology

## 2023-02-11 DIAGNOSIS — D259 Leiomyoma of uterus, unspecified: Secondary | ICD-10-CM

## 2023-02-11 DIAGNOSIS — N950 Postmenopausal bleeding: Secondary | ICD-10-CM

## 2023-02-11 NOTE — Progress Notes (Signed)
Called pt with results of EMB, benign, mostly blood Plan to move forward with MRI for further evaluation to r/o degenerating fibroid vs leiomyosarcoma  [name], DO Attending Obstetrician & Gynecologist, [facility] for [facility], [facility]

## 2023-02-20 ENCOUNTER — Ambulatory Visit (HOSPITAL_COMMUNITY)
Admission: RE | Admit: 2023-02-20 | Discharge: 2023-02-20 | Disposition: A | Discharge status: Home / Self Care | Payer: Medicare HMO | Source: Ambulatory Visit | Attending: Obstetrics & Gynecology | Admitting: Obstetrics & Gynecology

## 2023-02-20 DIAGNOSIS — N950 Postmenopausal bleeding: Secondary | ICD-10-CM | POA: Diagnosis present

## 2023-02-20 DIAGNOSIS — D259 Leiomyoma of uterus, unspecified: Secondary | ICD-10-CM | POA: Diagnosis present

## 2023-02-20 MED ORDER — GADOBUTROL 1 MMOL/ML IV SOLN
10.0000 mL | Freq: Once | INTRAVENOUS | Status: AC | PRN
  Administered 2023-02-20: 10 mL via INTRAVENOUS

## 2023-02-28 ENCOUNTER — Encounter: Payer: Self-pay | Admitting: Obstetrics & Gynecology

## 2023-02-28 ENCOUNTER — Ambulatory Visit: Payer: Medicare HMO | Admitting: Obstetrics & Gynecology

## 2023-02-28 VITALS — BP 127/66 | HR 72 | Ht 67.0 in | Wt 302.0 lb

## 2023-02-28 DIAGNOSIS — E1142 Type 2 diabetes mellitus with diabetic polyneuropathy: Secondary | ICD-10-CM

## 2023-02-28 DIAGNOSIS — D259 Leiomyoma of uterus, unspecified: Secondary | ICD-10-CM | POA: Diagnosis not present

## 2023-02-28 DIAGNOSIS — N950 Postmenopausal bleeding: Secondary | ICD-10-CM

## 2023-02-28 DIAGNOSIS — Z6841 Body Mass Index (BMI) 40.0 and over, adult: Secondary | ICD-10-CM

## 2023-02-28 NOTE — Progress Notes (Signed)
GYN VISIT Patient name: [name] MRN [id number]  Date of birth: [birth year] Chief Complaint:   Follow-up (Discuss next steps)  History of Present Illness:   [name] is a 67 y.o. G1P0010 PM female being seen today for the following concerns:  PMB: After the last biopsy, she had a few days of bleeding, but it has since resolved.  Prior to biopsy the last episode of bleeding was 5 wks ago.  Today notes no further bleeding, dishcarge, itching.  Denies pelvic pain.  Previously noted occasional episode of pink-spotting most noticeable after mowing the lawn.   EMB x 2 8/024: inactive/atrophic endometrium MRI 02/26/2023: 8.5 x 4.5 x 6.1 cm uterus.  Dominant fibroid in the anterior corpus both intramural and submucosal components measuring 3.1 cm.   Two smaller subserosal fibroids- 1.5cm and 0.8cm.  Normal ovaries bilaterally.  Reports no other acute gyn concerns today.   No LMP recorded. Patient is postmenopausal.    Review of Systems:   Pertinent items are noted in HPI Denies fever/chills, dizziness, headaches, visual disturbances, fatigue, shortness of breath, chest pain, abdominal pain, vomiting Pertinent History Reviewed:   Past Surgical History:  Procedure Laterality Date   ANAL FISSURE REPAIR  06/25/2010   Dr. Lovell Sheehan   COLONOSCOPY  01/29/2003   NUR: Five small polyps that were ablated/Tiny diverticula at sigmoid colon   COLONOSCOPY  03/03/2010   HKV:QQVZ canal papilla tender anal canal otherwise normal   COLONOSCOPY N/A 06/21/2014   Procedure: COLONOSCOPY;  Surgeon: Corbin Ade, MD;  [facility];  Service: Endoscopy;  Laterality: N/A;  745AM   COLONOSCOPY N/A 10/19/2020   Procedure: COLONOSCOPY;  Surgeon: Corbin Ade, MD;  [facility];  Service: Endoscopy;  Laterality: N/A;  ASA I / II / AM procedure   INCISE AND DRAIN ABCESS     melonoma removed     POLYPECTOMY  10/19/2020   Procedure: POLYPECTOMY;  Surgeon: Corbin Ade, MD;   [facility];  Service: Endoscopy;;    Past Medical History:  Diagnosis Date   Depression    Diabetes mellitus without complication (HCC)    Hypercholesterolemia    Melanoma (HCC)    Obesity    Obstructive sleep apnea on CPAP    Reviewed problem list, medications and allergies. Physical Assessment:   Vitals:   02/28/23 0854  BP: 127/66  Pulse: 72  Weight: (!) 302 lb (137 kg)  Height: 5\' 7"  (1.702 m)  Body mass index is 47.3 kg/m.       Physical Examination:   General appearance: alert, well appearing, and in no distress  Psych: mood appropriate, normal affect  Skin: warm & dry   Cardiovascular: normal heart rate noted  Respiratory: normal respiratory effort, no distress  Abdomen: obese, soft, non-tender   Pelvic: examination not indicated  Extremities: no edema   Chaperone: N/A    Assessment & Plan:  1) Postmenopausal bleeding, Uterine fibroid -reviewed recent MRI- ruled out for leiomyosarcoma.  Reassured pt of benign findings -EMB negative for endometrial Ca -discussed management options including monitor bleeding- should she not have any further bleeding- no further work up or treatment -discussed that should bleeding return within the next 6-12 mos would consider starting on progestin therapy -alternatively if bleeding returns or worsens may need to consider surgical intervention -questions/concerns were addressed, pt agreeable to above  -encouraged pt to work on weight management and DM control to lower risk of endometrial carcinoma  Return in about 1 year (around 02/28/2024) for Annual.   [name], DO Attending Obstetrician & Gynecologist, [facility] for [facility], [facility]

## 2023-03-18 ENCOUNTER — Ambulatory Visit: Payer: Medicare HMO | Admitting: Family Medicine

## 2023-03-18 ENCOUNTER — Ambulatory Visit: Payer: Medicare HMO | Admitting: Internal Medicine

## 2023-03-18 ENCOUNTER — Encounter: Payer: Self-pay | Admitting: Internal Medicine

## 2023-03-18 VITALS — BP 138/80 | HR 68 | Ht 67.0 in | Wt 296.6 lb

## 2023-03-18 DIAGNOSIS — I10 Essential (primary) hypertension: Secondary | ICD-10-CM | POA: Diagnosis not present

## 2023-03-18 DIAGNOSIS — E1142 Type 2 diabetes mellitus with diabetic polyneuropathy: Secondary | ICD-10-CM

## 2023-03-18 DIAGNOSIS — E1169 Type 2 diabetes mellitus with other specified complication: Secondary | ICD-10-CM

## 2023-03-18 DIAGNOSIS — E785 Hyperlipidemia, unspecified: Secondary | ICD-10-CM

## 2023-03-18 MED ORDER — LISINOPRIL 40 MG PO TABS: 40.0000 mg | ORAL_TABLET | Freq: Every day | ORAL | 3 refills | Status: DC

## 2023-03-18 MED ORDER — EZETIMIBE 10 MG PO TABS: 10.0000 mg | ORAL_TABLET | Freq: Every day | ORAL | 3 refills | Status: DC

## 2023-03-18 MED ORDER — PIOGLITAZONE HCL 30 MG PO TABS: 30.0000 mg | ORAL_TABLET | Freq: Every day | ORAL | 1 refills | Status: DC

## 2023-03-18 NOTE — Assessment & Plan Note (Signed)
A1c 7.4 on labs from June.  She checks her blood sugar each morning and readings are consistently above goal, typically 140-160s.  Current medication regimen consists of metformin 1000 mg twice daily, glimepiride 4 mg daily, and Actos 15 mg daily.  She endorses poor dietary habits since her husband passed, noting that she does not cook but eats fast food regularly. -Appropriate dietary habits aimed at lowering her blood sugar were reviewed today.  Through shared decision making, a referral to medical nutrition therapy has been placed. -Increase Actos to 30 mg daily -Continue metformin and glimepiride as currently prescribed -Consider GLP-1 therapy at future appointment

## 2023-03-18 NOTE — Assessment & Plan Note (Signed)
Current antihypertensive regimen consists of lisinopril 30 mg daily and HCTZ 12.5 mg daily.  She regularly checks her blood pressure at home and readings are consistently elevated.  BP today is 130/80. -Increase lisinopril to 40 mg daily for improved HTN control.  Continue HCTZ 12.5 mg daily.

## 2023-03-18 NOTE — Assessment & Plan Note (Signed)
BMI 46.4.  We discussed that morbid obesity is negatively contributing to multiple chronic medical conditions. -Last on modifications aimed at weight loss were reviewed today.  Through shared decision making, she has been referred to medical nutrition therapy to discuss appropriate dietary habits.

## 2023-03-18 NOTE — Assessment & Plan Note (Signed)
Lipid panel updated in June.  Total cholesterol 187 and LDL 108.  She endorses compliance with atorvastatin 80 mg daily. -Add Zetia 10 mg daily

## 2023-03-18 NOTE — Patient Instructions (Signed)
It was a pleasure to see you today.  Thank you for giving [place] the opportunity to be involved in your care.  Below is a brief recap of your visit and next steps.  We will plan to see you again in 3 months.   Summary Increase lisinopril to 40 mg daily Increase Actos to 30 mg daily Add Zetia 10 mg daily for high cholesterol Dietician referral placed I recommend increasing vitamin D to 2000 units daily Follow up in 3 months

## 2023-03-18 NOTE — Progress Notes (Signed)
Established Patient Office Visit  Subjective   Patient ID: [name], female    DOB: 08-22-1955  Age: 67 y.o. MRN: [id number]  Chief Complaint  Patient presents with   Follow-up    Follow up    [name] returns to care today for routine follow-up.  She was last evaluated by me on 6/14 as a new patient presenting to establish care.  At that time she requested assistance with obtaining a new mask for her CPAP machine and requested medication refills.  Repeat labs were ordered and 58-month follow-up was arranged.  In the interim, she has been seen by gynecology for management of vaginal spotting.  There have otherwise been no acute interval events.  [name] reports feeling well today.  She is asymptomatic and has no acute concerns to discuss.  Past Medical History:  Diagnosis Date   Depression    Diabetes mellitus without complication (HCC)    Hypercholesterolemia    Melanoma (HCC)    Obesity    Obstructive sleep apnea on CPAP    Past Surgical History:  Procedure Laterality Date   ANAL FISSURE REPAIR  06/25/2010   Dr. Lovell Sheehan   COLONOSCOPY  01/29/2003   NUR: Five small polyps that were ablated/Tiny diverticula at sigmoid colon   COLONOSCOPY  03/03/2010   WUJ:WJXB canal papilla tender anal canal otherwise normal   COLONOSCOPY N/A 06/21/2014   Procedure: COLONOSCOPY;  Surgeon: Corbin Ade, MD;  [facility];  Service: Endoscopy;  Laterality: N/A;  745AM   COLONOSCOPY N/A 10/19/2020   Procedure: COLONOSCOPY;  Surgeon: Corbin Ade, MD;  [facility];  Service: Endoscopy;  Laterality: N/A;  ASA I / II / AM procedure   INCISE AND DRAIN ABCESS     melonoma removed     POLYPECTOMY  10/19/2020   Procedure: POLYPECTOMY;  Surgeon: Corbin Ade, MD;  [facility];  Service: Endoscopy;;   Social History   Tobacco Use   Smoking status: Former    Current packs/day: 0.00    Types: Cigarettes    Quit date: 02/26/2000    Years since  quitting: 23.0   Smokeless tobacco: Never  Vaping Use   Vaping status: Never Used  Substance Use Topics   Alcohol use: No    Alcohol/week: 0.0 standard drinks of alcohol   Drug use: No   Family History  Problem Relation Age of Onset   Stroke Mother    Diabetes Brother    Cancer Brother        bone marrow   Colon cancer Neg Hx    Allergies  Allergen Reactions   Clomipramine Hcl Other (See Comments)    Caused hot flashes   Elemental Sulfur Hives   Sulfa Antibiotics Hives   Review of Systems  Constitutional:  Negative for chills and fever.  HENT:  Negative for sore throat.   Respiratory:  Negative for cough and shortness of breath.   Cardiovascular:  Negative for chest pain, palpitations and leg swelling.  Gastrointestinal:  Negative for abdominal pain, blood in stool, constipation, diarrhea, nausea and vomiting.  Genitourinary:  Negative for dysuria and hematuria.  Musculoskeletal:  Negative for myalgias.  Skin:  Negative for itching and rash.  Neurological:  Negative for dizziness and headaches.  Psychiatric/Behavioral:  Negative for depression and suicidal ideas.      Objective:     BP 138/80 (BP Location: Right Arm, Patient Position: Sitting, Cuff Size: Large)   Pulse  68   Ht 5\' 7"  (1.702 m)   Wt 296 lb 9.6 oz (134.5 kg)   SpO2 93%   BMI 46.45 kg/m  BP Readings from Last 3 Encounters:  03/18/23 138/80  02/28/23 127/66  02/01/23 (!) 147/74   Physical Exam Vitals reviewed.  Constitutional:      General: She is not in acute distress.    Appearance: Normal appearance. She is obese. She is not toxic-appearing.  HENT:     Head: Normocephalic and atraumatic.     Right Ear: External ear normal.     Left Ear: External ear normal.     Nose: Nose normal. No congestion or rhinorrhea.     Mouth/Throat:     Mouth: Mucous membranes are moist.     Pharynx: Oropharynx is clear. No oropharyngeal exudate or posterior oropharyngeal erythema.  Eyes:     General: No  scleral icterus.    Extraocular Movements: Extraocular movements intact.     Conjunctiva/sclera: Conjunctivae normal.     Pupils: Pupils are equal, round, and reactive to light.  Cardiovascular:     Rate and Rhythm: Normal rate and regular rhythm.     Pulses: Normal pulses.     Heart sounds: Normal heart sounds. No murmur heard.    No friction rub. No gallop.  Pulmonary:     Effort: Pulmonary effort is normal.     Breath sounds: Normal breath sounds. No wheezing, rhonchi or rales.  Abdominal:     General: Abdomen is flat. Bowel sounds are normal. There is no distension.     Palpations: Abdomen is soft.     Tenderness: There is no abdominal tenderness.  Musculoskeletal:        General: No swelling. Normal range of motion.     Cervical back: Normal range of motion.     Right lower leg: No edema.     Left lower leg: No edema.  Lymphadenopathy:     Cervical: No cervical adenopathy.  Skin:    General: Skin is warm and dry.     Capillary Refill: Capillary refill takes less than 2 seconds.     Coloration: Skin is not jaundiced.  Neurological:     General: No focal deficit present.     Mental Status: She is alert and oriented to person, place, and time.  Psychiatric:        Mood and Affect: Mood normal.        Behavior: Behavior normal.   Last CBC Lab Results  Component Value Date   WBC 5.5 12/07/2022   HGB 13.1 12/07/2022   HCT 40.6 12/07/2022   MCV 92 12/07/2022   MCH 29.6 12/07/2022   RDW 13.6 12/07/2022   PLT 233 12/07/2022   Last metabolic panel Lab Results  Component Value Date   GLUCOSE 155 (H) 12/07/2022   NA 142 12/07/2022   K 4.0 12/07/2022   CL 105 12/07/2022   CO2 22 12/07/2022   BUN 12 12/07/2022   CREATININE 0.78 12/07/2022   EGFR 83 12/07/2022   CALCIUM 9.3 12/07/2022   PROT 6.3 12/07/2022   ALBUMIN 4.1 12/07/2022   LABGLOB 2.2 12/07/2022   AGRATIO 1.9 12/07/2022   BILITOT 0.3 12/07/2022   ALKPHOS 72 12/07/2022   AST 10 12/07/2022   ALT 12  12/07/2022   ANIONGAP 10 01/23/2020   Last lipids Lab Results  Component Value Date   CHOL 187 12/07/2022   HDL 60 12/07/2022   LDLCALC 108 (H) 12/07/2022   TRIG  105 12/07/2022   CHOLHDL 3.1 12/07/2022   Last hemoglobin A1c Lab Results  Component Value Date   HGBA1C 7.4 (H) 12/07/2022   Last thyroid functions Lab Results  Component Value Date   TSH 2.110 12/07/2022   Last vitamin D Lab Results  Component Value Date   VD25OH 31.7 12/07/2022   Last vitamin B12 and Folate Lab Results  Component Value Date   VITAMINB12 808 12/07/2022   FOLATE 12.3 12/07/2022   The 10-year ASCVD risk score (Arnett DK, et al., 2019) is: 20.6%    Assessment & Plan:   Problem List Items Addressed This Visit       Essential hypertension    Current antihypertensive regimen consists of lisinopril 30 mg daily and HCTZ 12.5 mg daily.  She regularly checks her blood pressure at home and readings are consistently elevated.  BP today is 130/80. -Increase lisinopril to 40 mg daily for improved HTN control.  Continue HCTZ 12.5 mg daily.      Type 2 diabetes mellitus with diabetic polyneuropathy (HCC) - Primary    A1c 7.4 on labs from June.  She checks her blood sugar each morning and readings are consistently above goal, typically 140-160s.  Current medication regimen consists of metformin 1000 mg twice daily, glimepiride 4 mg daily, and Actos 15 mg daily.  She endorses poor dietary habits since her husband passed, noting that she does not cook but eats fast food regularly. -Appropriate dietary habits aimed at lowering her blood sugar were reviewed today.  Through shared decision making, a referral to medical nutrition therapy has been placed. -Increase Actos to 30 mg daily -Continue metformin and glimepiride as currently prescribed -Consider GLP-1 therapy at future appointment      Hyperlipidemia associated with type 2 diabetes mellitus (HCC)    Lipid panel updated in June.  Total cholesterol  187 and LDL 108.  She endorses compliance with atorvastatin 80 mg daily. -Add Zetia 10 mg daily      Morbid obesity (HCC)    BMI 46.4.  We discussed that morbid obesity is negatively contributing to multiple chronic medical conditions. -Last on modifications aimed at weight loss were reviewed today.  Through shared decision making, she has been referred to medical nutrition therapy to discuss appropriate dietary habits.      Return in about 3 months (around 06/17/2023).   Billie Lade, MD

## 2023-03-29 ENCOUNTER — Telehealth: Payer: Self-pay | Admitting: *Deleted

## 2023-03-29 NOTE — Telephone Encounter (Signed)
Patient called and requested we reorder her CPAP supplies---her PCP Dr. [name] had been handling that and she is now seeing Dr. Durwin Nora ar [facility].   Patient states he told her at her last visit he would take care of ordering her supplies, but she has no received any thing.  I informed patient that we could not order supplies for her since we had never seen her as a patient, she would have to talk with Dr. Darletta Moll office again.  She voiced her understanding.

## 2023-04-30 ENCOUNTER — Ambulatory Visit: Payer: Medicare HMO

## 2023-04-30 ENCOUNTER — Ambulatory Visit: Payer: Medicare HMO | Admitting: Internal Medicine

## 2023-04-30 NOTE — Progress Notes (Signed)
Pt. Received immunization without complications.

## 2023-05-13 ENCOUNTER — Encounter: Payer: Medicare HMO | Attending: Internal Medicine | Admitting: Nutrition

## 2023-05-13 VITALS — Ht 67.0 in | Wt 303.0 lb

## 2023-05-13 DIAGNOSIS — Z7984 Long term (current) use of oral hypoglycemic drugs: Secondary | ICD-10-CM | POA: Diagnosis not present

## 2023-05-13 DIAGNOSIS — I10 Essential (primary) hypertension: Secondary | ICD-10-CM | POA: Diagnosis not present

## 2023-05-13 DIAGNOSIS — E1142 Type 2 diabetes mellitus with diabetic polyneuropathy: Secondary | ICD-10-CM

## 2023-05-13 DIAGNOSIS — E1169 Type 2 diabetes mellitus with other specified complication: Secondary | ICD-10-CM | POA: Insufficient documentation

## 2023-05-13 DIAGNOSIS — E785 Hyperlipidemia, unspecified: Secondary | ICD-10-CM | POA: Diagnosis not present

## 2023-05-13 NOTE — Patient Instructions (Signed)
Goals Start buying water 1 Drink bottle of water per day Cut down to 2 diet mt dews a day Increase fresh fruits, vegetables and whole grains. Aim for BS less than 150 before breakfast and less than 180 before bed. A1C to get 7%

## 2023-05-13 NOTE — Progress Notes (Unsigned)
Medical Nutrition Therapy  Appointment Start time:  0800  Appointment End time:  0900  Primary concerns today: Dm Type 2, Hyperlipidemia, HTN, Morbid obesity  Referral diagnosis:E 11.69, E78, I10, E66.01 Preferred learning style: NO Preference  Learning readiness: Ready    NUTRITION ASSESSMENT  67 yr old female referred for DM Type 2, Obesity, HTN and Hyperlipidemia. PCP Dr. Durwin Nora. A1C 7.4%. CUrrenlty taking Metformin 1000 mg BID, Actos and Glimepiride. FBS 140-160's. Eats out mostly since she lives by herself.  She is willing to work in eating heathier foods to include more whole plant based foods and work on cutting out processed and convenience foods to help her lose weight and improve her chronic diseases.  .  Clinical Medical Hx:  Past Medical History:  Diagnosis Date   Depression    Diabetes mellitus without complication (HCC)    Hypercholesterolemia    Melanoma (HCC)    Obesity    Obstructive sleep apnea on CPAP     Medications:  Current Outpatient Medications on File Prior to Visit  Medication Sig Dispense Refill   albuterol (VENTOLIN HFA) 108 (90 Base) MCG/ACT inhaler SMARTSIG:By Mouth (Patient not taking: Reported on 02/01/2023)     atorvastatin (LIPITOR) 80 MG tablet Take 80 mg by mouth in the morning.     cholecalciferol (VITAMIN D) 25 MCG (1000 UNIT) tablet Take 1,000 Units by mouth in the morning.     citalopram (CELEXA) 40 MG tablet Take 40 mg by mouth in the morning.     docusate sodium (COLACE) 100 MG capsule Take 200 mg by mouth every evening.     ezetimibe (ZETIA) 10 MG tablet Take 1 tablet (10 mg total) by mouth daily. 90 tablet 3   glimepiride (AMARYL) 4 MG tablet Take 1 tablet (4 mg total) by mouth daily with breakfast. Takes 2 tablets every morning. 90 tablet 1   hydrochlorothiazide (HYDRODIURIL) 12.5 MG tablet Take 12.5 mg by mouth every morning.     lisinopril (ZESTRIL) 40 MG tablet Take 1 tablet (40 mg total) by mouth daily. 90 tablet 3   metFORMIN  (GLUCOPHAGE-XR) 500 MG 24 hr tablet Take 2 tablets (1,000 mg total) by mouth 2 (two) times daily. 360 tablet 1   pioglitazone (ACTOS) 30 MG tablet Take 1 tablet (30 mg total) by mouth daily. 90 tablet 1   vitamin B-12 (CYANOCOBALAMIN) 1000 MCG tablet Take 1,000 mcg by mouth in the morning.     No current facility-administered medications on file prior to visit.    Labs:  Lab Results  Component Value Date   HGBA1C 7.4 (H) 12/07/2022      Latest Ref Rng & Units 12/07/2022    9:49 AM 01/23/2020    2:12 PM 03/14/2010    7:55 AM  CMP  Glucose 70 - 99 mg/dL 161  97  096   BUN 8 - 27 mg/dL 12  17  14    Creatinine 0.57 - 1.00 mg/dL 0.45  4.09  8.11   Sodium 134 - 144 mmol/L 142  140  139   Potassium 3.5 - 5.2 mmol/L 4.0  3.5  4.3   Chloride 96 - 106 mmol/L 105  109  104   CO2 20 - 29 mmol/L 22  21  27    Calcium 8.7 - 10.3 mg/dL 9.3  9.0  8.9   Total Protein 6.0 - 8.5 g/dL 6.3     Total Bilirubin 0.0 - 1.2 mg/dL 0.3     Alkaline Phos 44 - 121  IU/L 72     AST 0 - 40 IU/L 10     ALT 0 - 32 IU/L 12      Lipid Panel     Component Value Date/Time   CHOL 187 12/07/2022 0949   TRIG 105 12/07/2022 0949   HDL 60 12/07/2022 0949   CHOLHDL 3.1 12/07/2022 0949   LDLCALC 108 (H) 12/07/2022 0949   LABVLDL 19 12/07/2022 0949    Notable Signs/Symptoms: fatigue, craves sweets, increased urination  Lifestyle & Dietary Hx Lives by herself  Estimated daily fluid intake: 60 oz- Diet Mt Dew-3 per day Supplements:  Sleep: poor  Stress / self-care: some Current average weekly physical activity: ADL  24-Hr Dietary Recall Doesn't cook much. Eats out a lot . Doesn't drink water  Estimated Energy Needs Calories: 1200 Carbohydrate: 135g Protein: 90g Fat: 33g   NUTRITION DIAGNOSIS  NB-1.1 Food and nutrition-related knowledge deficit As related to high calorie high carb processed food diet.  As evidenced by DM Type 2 A1C 7.4%, Morbid obesity, HTN and Hyperlipidemai.   NUTRITION INTERVENTION   Nutrition education (E-1) on the following topics:  Nutrition and Diabetes education provided on My Plate, CHO counting, meal planning, portion sizes, timing of meals, avoiding snacks between meals unless having a low blood sugar, target ranges for A1C and blood sugars, signs/symptoms and treatment of hyper/hypoglycemia, monitoring blood sugars, taking medications as prescribed, benefits of exercising 30 minutes per day and prevention of complications of DM.  Lifestyle Medicine  - Whole Food, Plant Predominant Nutrition is highly recommended: Eat Plenty of vegetables, Mushrooms, fruits, Legumes, Whole Grains, Nuts, seeds in lieu of processed meats, processed snacks/pastries red meat, poultry, eggs.    -It is better to avoid simple carbohydrates including: Cakes, Sweet Desserts, Ice Cream, Soda (diet and regular), Sweet Tea, Candies, Chips, Cookies, Store Bought Juices, Alcohol in Excess of  1-2 drinks a day, Lemonade,  Artificial Sweeteners, Doughnuts, Coffee Creamers, "Sugar-free" Products, etc, etc.  This is not a complete list.....  Exercise: If you are able: 30 -60 minutes a day ,4 days a week, or 150 minutes a week.  The longer the better.  Combine stretch, strength, and aerobic activities.  If you were told in the past that you have high risk for cardiovascular diseases, you may seek evaluation by your heart doctor prior to initiating moderate to intense exercise programs.   Handouts Provided Include  Know your numbers Lifestyle Medicine handouts  Learning Style & Readiness for Change Teaching method utilized: Visual & Auditory  Demonstrated degree of understanding via: Teach Back  Barriers to learning/adherence to lifestyle change: motivation to make lifestyle changes  Goals Established by Pt Goals Start buying water 1 Drink bottle of water per day Cut down to 2 diet mt dews a day Increase fresh fruits, vegetables and whole grains. Aim for BS less than 150 before breakfast and  less than 180 before bed. A1C to get 7%   MONITORING & EVALUATION Dietary intake, weekly physical activity, and weight and BS in 1 month.  Next Steps  Patient is to work on planning meals and cooking at home.Marland Kitchen

## 2023-05-15 ENCOUNTER — Encounter: Payer: Self-pay | Admitting: Nutrition

## 2023-05-17 ENCOUNTER — Telehealth: Payer: Self-pay | Admitting: Adult Health

## 2023-05-17 NOTE — Telephone Encounter (Signed)
Pt states she is currently spotting and needs medication. Please Advise

## 2023-05-17 NOTE — Telephone Encounter (Signed)
Left message @ 11:47 am. JSY

## 2023-05-20 ENCOUNTER — Telehealth: Payer: Self-pay | Admitting: Obstetrics & Gynecology

## 2023-05-20 DIAGNOSIS — N950 Postmenopausal bleeding: Secondary | ICD-10-CM

## 2023-05-20 MED ORDER — NORETHINDRONE ACETATE 5 MG PO TABS
5.0000 mg | ORAL_TABLET | Freq: Every day | ORAL | 0 refills | Status: DC
Start: 2023-05-20 — End: 2023-05-29

## 2023-05-20 NOTE — Telephone Encounter (Signed)
Pt states she is bleeding again and would like medication. Please advise

## 2023-05-20 NOTE — Telephone Encounter (Addendum)
Pt had some spotting last month and this month. Was told to let you know so a medication could be sent in. Thanks! JSY

## 2023-05-29 ENCOUNTER — Other Ambulatory Visit: Payer: Self-pay | Admitting: *Deleted

## 2023-05-29 DIAGNOSIS — N950 Postmenopausal bleeding: Secondary | ICD-10-CM

## 2023-05-29 MED ORDER — NORETHINDRONE ACETATE 5 MG PO TABS: 5.0000 mg | ORAL_TABLET | Freq: Every day | ORAL | 0 refills | Status: DC

## 2023-06-05 ENCOUNTER — Encounter: Payer: Medicare HMO | Attending: Internal Medicine | Admitting: Nutrition

## 2023-06-05 DIAGNOSIS — I10 Essential (primary) hypertension: Secondary | ICD-10-CM | POA: Insufficient documentation

## 2023-06-05 DIAGNOSIS — E1142 Type 2 diabetes mellitus with diabetic polyneuropathy: Secondary | ICD-10-CM | POA: Insufficient documentation

## 2023-06-05 NOTE — Progress Notes (Unsigned)
Medical Nutrition Therapy  Appointment Start time:  0800  Appointment End time:  0900  Primary concerns today: Dm Type 2, Hyperlipidemia, HTN, Morbid obesity  Referral diagnosis:E 11.69, E78, I10, E66.01 Preferred learning style: NO Preference  Learning readiness: Ready    NUTRITION ASSESSMENT  67 yr old female referred for DM Type 2, Obesity, HTN and Hyperlipidemia. PCP Dr. Durwin Nora. A1C 7.4%. Currently taking Metformin 1000 mg BID, Actos and Glimepiride. FBS 140-160's. Eats out mostly since she lives by herself and doesn't like to cook for just herself. Has been drinking 1 bottle of water every day now.. Still drinking Diet Mt Dew. Tried a Investment banker, corporate Meal with cauliflower and liked it.   She is willing to work in eating heathier foods to include more whole plant based foods and work on cutting out processed and convenience foods to help her lose weight and improve her chronic diseases.  .  Clinical Medical Hx:  Past Medical History:  Diagnosis Date   Depression    Diabetes mellitus without complication (HCC)    Hypercholesterolemia    Melanoma (HCC)    Obesity    Obstructive sleep apnea on CPAP     Medications:  Current Outpatient Medications on File Prior to Visit  Medication Sig Dispense Refill   albuterol (VENTOLIN HFA) 108 (90 Base) MCG/ACT inhaler SMARTSIG:By Mouth (Patient not taking: Reported on 02/01/2023)     atorvastatin (LIPITOR) 80 MG tablet Take 80 mg by mouth in the morning.     cholecalciferol (VITAMIN D) 25 MCG (1000 UNIT) tablet Take 1,000 Units by mouth in the morning.     citalopram (CELEXA) 40 MG tablet Take 40 mg by mouth in the morning.     docusate sodium (COLACE) 100 MG capsule Take 200 mg by mouth every evening.     ezetimibe (ZETIA) 10 MG tablet Take 1 tablet (10 mg total) by mouth daily. 90 tablet 3   glimepiride (AMARYL) 4 MG tablet Take 1 tablet (4 mg total) by mouth daily with breakfast. Takes 2 tablets every morning. 90 tablet 1    hydrochlorothiazide (HYDRODIURIL) 12.5 MG tablet Take 12.5 mg by mouth every morning.     lisinopril (ZESTRIL) 40 MG tablet Take 1 tablet (40 mg total) by mouth daily. 90 tablet 3   metFORMIN (GLUCOPHAGE-XR) 500 MG 24 hr tablet Take 2 tablets (1,000 mg total) by mouth 2 (two) times daily. 360 tablet 1   norethindrone (AYGESTIN) 5 MG tablet Take 1 tablet (5 mg total) by mouth daily. 90 tablet 0   pioglitazone (ACTOS) 30 MG tablet Take 1 tablet (30 mg total) by mouth daily. 90 tablet 1   vitamin B-12 (CYANOCOBALAMIN) 1000 MCG tablet Take 1,000 mcg by mouth in the morning.     No current facility-administered medications on file prior to visit.    Labs:  Lab Results  Component Value Date   HGBA1C 7.4 (H) 12/07/2022      Latest Ref Rng & Units 12/07/2022    9:49 AM 01/23/2020    2:12 PM 03/14/2010    7:55 AM  CMP  Glucose 70 - 99 mg/dL 161  97  096   BUN 8 - 27 mg/dL 12  17  14    Creatinine 0.57 - 1.00 mg/dL 0.45  4.09  8.11   Sodium 134 - 144 mmol/L 142  140  139   Potassium 3.5 - 5.2 mmol/L 4.0  3.5  4.3   Chloride 96 - 106 mmol/L 105  109  104   CO2 20 - 29 mmol/L 22  21  27    Calcium 8.7 - 10.3 mg/dL 9.3  9.0  8.9   Total Protein 6.0 - 8.5 g/dL 6.3     Total Bilirubin 0.0 - 1.2 mg/dL 0.3     Alkaline Phos 44 - 121 IU/L 72     AST 0 - 40 IU/L 10     ALT 0 - 32 IU/L 12      Lipid Panel     Component Value Date/Time   CHOL 187 12/07/2022 0949   TRIG 105 12/07/2022 0949   HDL 60 12/07/2022 0949   CHOLHDL 3.1 12/07/2022 0949   LDLCALC 108 (H) 12/07/2022 0949   LABVLDL 19 12/07/2022 0949    Notable Signs/Symptoms: fatigue, craves sweets, increased urination  Lifestyle & Dietary Hx Lives by herself  Estimated daily fluid intake: 60 oz- Diet Mt Dew-3 per day Supplements:  Sleep: poor  Stress / self-care: some Current average weekly physical activity: ADL  24-Hr Dietary Recall Doesn't cook much. Eats out a lot . Doesn't drink water  Estimated Energy Needs Calories:  1200 Carbohydrate: 135g Protein: 90g Fat: 33g   NUTRITION DIAGNOSIS  NB-1.1 Food and nutrition-related knowledge deficit As related to high calorie high carb processed food diet.  As evidenced by DM Type 2 A1C 7.4%, Morbid obesity, HTN and Hyperlipidemai.   NUTRITION INTERVENTION  Nutrition education (E-1) on the following topics:  Nutrition and Diabetes education provided on My Plate, CHO counting, meal planning, portion sizes, timing of meals, avoiding snacks between meals unless having a low blood sugar, target ranges for A1C and blood sugars, signs/symptoms and treatment of hyper/hypoglycemia, monitoring blood sugars, taking medications as prescribed, benefits of exercising 30 minutes per day and prevention of complications of DM.  Lifestyle Medicine  - Whole Food, Plant Predominant Nutrition is highly recommended: Eat Plenty of vegetables, Mushrooms, fruits, Legumes, Whole Grains, Nuts, seeds in lieu of processed meats, processed snacks/pastries red meat, poultry, eggs.    -It is better to avoid simple carbohydrates including: Cakes, Sweet Desserts, Ice Cream, Soda (diet and regular), Sweet Tea, Candies, Chips, Cookies, Store Bought Juices, Alcohol in Excess of  1-2 drinks a day, Lemonade,  Artificial Sweeteners, Doughnuts, Coffee Creamers, "Sugar-free" Products, etc, etc.  This is not a complete list.....  Exercise: If you are able: 30 -60 minutes a day ,4 days a week, or 150 minutes a week.  The longer the better.  Combine stretch, strength, and aerobic activities.  If you were told in the past that you have high risk for cardiovascular diseases, you may seek evaluation by your heart doctor prior to initiating moderate to intense exercise programs.   Handouts Provided Include  Know your numbers Lifestyle Medicine handouts  Learning Style & Readiness for Change Teaching method utilized: Visual & Auditory  Demonstrated degree of understanding via: Teach Back  Barriers to  learning/adherence to lifestyle change: motivation to make lifestyle changes  Goals Established by Pt Goals Start buying water-done 1 Drink bottle of water per day-done Cut down to 2 diet mt dews a day-working on it. Increase fresh fruits, vegetables and whole grains.-working on it. Aim for BS less than 150 before breakfast and less than 180 before bed. A1C to get 7%   MONITORING & EVALUATION Dietary intake, weekly physical activity, and weight and BS in 3 month.  Next Steps  Patient is to work on planning meals and cooking at home.Marland Kitchen

## 2023-06-06 NOTE — Patient Instructions (Signed)
Goals  Look into the Power Bowl Meals for meals as they meet the healthy guidelines. Keep frozen meal dinners to 600 mg or less per meal.

## 2023-06-17 ENCOUNTER — Encounter: Payer: Self-pay | Admitting: Internal Medicine

## 2023-06-17 ENCOUNTER — Ambulatory Visit: Payer: Self-pay | Admitting: Internal Medicine

## 2023-06-17 VITALS — BP 138/63 | HR 73 | Ht 67.0 in | Wt 302.6 lb

## 2023-06-17 DIAGNOSIS — E1142 Type 2 diabetes mellitus with diabetic polyneuropathy: Secondary | ICD-10-CM | POA: Diagnosis not present

## 2023-06-17 DIAGNOSIS — Z7984 Long term (current) use of oral hypoglycemic drugs: Secondary | ICD-10-CM

## 2023-06-17 DIAGNOSIS — I10 Essential (primary) hypertension: Secondary | ICD-10-CM | POA: Diagnosis not present

## 2023-06-17 DIAGNOSIS — R051 Acute cough: Secondary | ICD-10-CM

## 2023-06-17 DIAGNOSIS — Z23 Encounter for immunization: Secondary | ICD-10-CM

## 2023-06-17 DIAGNOSIS — R058 Other specified cough: Secondary | ICD-10-CM

## 2023-06-17 DIAGNOSIS — G4733 Obstructive sleep apnea (adult) (pediatric): Secondary | ICD-10-CM

## 2023-06-17 MED ORDER — PIOGLITAZONE HCL 30 MG PO TABS
30.0000 mg | ORAL_TABLET | Freq: Every day | ORAL | 1 refills | Status: DC
Start: 2023-06-17 — End: 2023-12-18

## 2023-06-17 MED ORDER — GLIMEPIRIDE 4 MG PO TABS
4.0000 mg | ORAL_TABLET | Freq: Every day | ORAL | 1 refills | Status: DC
Start: 2023-06-17 — End: 2023-09-16

## 2023-06-17 MED ORDER — BENZONATATE 100 MG PO CAPS
100.0000 mg | ORAL_CAPSULE | Freq: Three times a day (TID) | ORAL | 0 refills | Status: DC | PRN
Start: 2023-06-17 — End: 2023-09-16

## 2023-06-17 MED ORDER — METFORMIN HCL ER 500 MG PO TB24: 1000.0000 mg | ORAL_TABLET | Freq: Two times a day (BID) | ORAL | 1 refills | Status: DC

## 2023-06-17 NOTE — Assessment & Plan Note (Signed)
A1c 7.4 on labs from June.  Actos was increased to 30 mg daily at her last appointment.  She is additionally prescribed metformin 1000 mg twice daily and glimepiride 4 mg daily.  She checks her blood sugar daily and notes that readings are consistently lower than 3 months ago.  She has also established care with medical nutrition therapy and feels that it has been beneficial. -Repeat A1c ordered today

## 2023-06-17 NOTE — Assessment & Plan Note (Signed)
She endorses nightly compliance with CPAP and requests CPAP supplies. Will follow-up with DME company.

## 2023-06-17 NOTE — Patient Instructions (Signed)
It was a pleasure to see you today.  Thank you for giving [place] the opportunity to be involved in your care.  Below is a brief recap of your visit and next steps.  We will plan to see you again in 3 months.  Summary No medication changes today Repeat labs ordered Refills provided Tessalon perles added for cough relief Pneumonia vaccine today Follow up in 3 months

## 2023-06-17 NOTE — Assessment & Plan Note (Signed)
Tessalon Perles added for as needed cough relief

## 2023-06-17 NOTE — Progress Notes (Signed)
Established Patient Office Visit  Subjective   Patient ID: [name], female    DOB: [birth year]  Age: 67 y.o. MRN: [id number]  Chief Complaint  Patient presents with   Follow-up    Follow up needs cpap machine cough   [name] returns today for routine follow-up.  She was last evaluated by me on 9/23.  Lisinopril was increased to 40 mg daily for improved HTN control, Actos was increased to 30 mg daily, a referral was placed to medical nutrition therapy, and Zetia 10 mg daily was added.  73-month follow-up was arranged.  In the interim, she has been evaluated by medical nutrition therapy.  There have otherwise been no acute interval events.  [name] reports feeling fairly well today.  She endorses a dry cough but is otherwise asymptomatic.  Her additional concern today is requesting CPAP supplies.  Past Medical History:  Diagnosis Date   Depression    Diabetes mellitus without complication (HCC)    Hypercholesterolemia    Melanoma (HCC)    Obesity    Obstructive sleep apnea on CPAP    Past Surgical History:  Procedure Laterality Date   ANAL FISSURE REPAIR  06/25/2010   Dr. Lovell Sheehan   COLONOSCOPY  01/29/2003   NUR: Five small polyps that were ablated/Tiny diverticula at sigmoid colon   COLONOSCOPY  03/03/2010   WUJ:WJXB canal papilla tender anal canal otherwise normal   COLONOSCOPY N/A 06/21/2014   Procedure: COLONOSCOPY;  Surgeon: Corbin Ade, MD;  Location: [facility];  Service: Endoscopy;  Laterality: N/A;  745AM   COLONOSCOPY N/A 10/19/2020   Procedure: COLONOSCOPY;  Surgeon: Corbin Ade, MD;  Location: [facility];  Service: Endoscopy;  Laterality: N/A;  ASA I / II / AM procedure   INCISE AND DRAIN ABCESS     melonoma removed     POLYPECTOMY  10/19/2020   Procedure: POLYPECTOMY;  Surgeon: Corbin Ade, MD;  Location: [facility];  Service: Endoscopy;;   Social History   Tobacco Use   Smoking status: Former    Current packs/day: 0.00     Types: Cigarettes    Quit date: 02/26/2000    Years since quitting: 23.3   Smokeless tobacco: Never  Vaping Use   Vaping status: Never Used  Substance Use Topics   Alcohol use: No    Alcohol/week: 0.0 standard drinks of alcohol   Drug use: No   Family History  Problem Relation Age of Onset   Stroke Mother    Diabetes Brother    Cancer Brother        bone marrow   Colon cancer Neg Hx    Allergies  Allergen Reactions   Clomipramine Hcl Other (See Comments)    Caused hot flashes   Elemental Sulfur Hives   Sulfa Antibiotics Hives   Review of Systems  Respiratory:  Positive for cough.      Objective:     BP 138/63 (BP Location: Right Arm, Patient Position: Sitting, Cuff Size: Large)   Pulse 73   Ht 5\' 7"  (1.702 m)   Wt (!) 302 lb 9.6 oz (137.3 kg)   SpO2 94%   BMI 47.39 kg/m  BP Readings from Last 3 Encounters:  06/17/23 138/63  03/18/23 138/80  02/28/23 127/66   Physical Exam Vitals reviewed.  Constitutional:      General: She is not in acute distress.    Appearance: Normal appearance. She is obese. She is not toxic-appearing.  HENT:  Head: Normocephalic and atraumatic.     Right Ear: External ear normal.     Left Ear: External ear normal.     Nose: Nose normal. No congestion or rhinorrhea.     Mouth/Throat:     Mouth: Mucous membranes are moist.     Pharynx: Oropharynx is clear. No oropharyngeal exudate or posterior oropharyngeal erythema.  Eyes:     General: No scleral icterus.    Extraocular Movements: Extraocular movements intact.     Conjunctiva/sclera: Conjunctivae normal.     Pupils: Pupils are equal, round, and reactive to light.  Cardiovascular:     Rate and Rhythm: Normal rate and regular rhythm.     Pulses: Normal pulses.     Heart sounds: Normal heart sounds. No murmur heard.    No friction rub. No gallop.  Pulmonary:     Effort: Pulmonary effort is normal.     Breath sounds: Normal breath sounds. No wheezing, rhonchi or rales.   Abdominal:     General: Abdomen is flat. Bowel sounds are normal. There is no distension.     Palpations: Abdomen is soft.     Tenderness: There is no abdominal tenderness.  Musculoskeletal:        General: No swelling. Normal range of motion.     Cervical back: Normal range of motion.     Right lower leg: No edema.     Left lower leg: No edema.  Lymphadenopathy:     Cervical: No cervical adenopathy.  Skin:    General: Skin is warm and dry.     Capillary Refill: Capillary refill takes less than 2 seconds.     Coloration: Skin is not jaundiced.  Neurological:     General: No focal deficit present.     Mental Status: She is alert and oriented to person, place, and time.  Psychiatric:        Mood and Affect: Mood normal.        Behavior: Behavior normal.   Last CBC Lab Results  Component Value Date   WBC 5.5 12/07/2022   HGB 13.1 12/07/2022   HCT 40.6 12/07/2022   MCV 92 12/07/2022   MCH 29.6 12/07/2022   RDW 13.6 12/07/2022   PLT 233 12/07/2022   Last metabolic panel Lab Results  Component Value Date   GLUCOSE 155 (H) 12/07/2022   NA 142 12/07/2022   K 4.0 12/07/2022   CL 105 12/07/2022   CO2 22 12/07/2022   BUN 12 12/07/2022   CREATININE 0.78 12/07/2022   EGFR 83 12/07/2022   CALCIUM 9.3 12/07/2022   PROT 6.3 12/07/2022   ALBUMIN 4.1 12/07/2022   LABGLOB 2.2 12/07/2022   AGRATIO 1.9 12/07/2022   BILITOT 0.3 12/07/2022   ALKPHOS 72 12/07/2022   AST 10 12/07/2022   ALT 12 12/07/2022   ANIONGAP 10 01/23/2020   Last lipids Lab Results  Component Value Date   CHOL 187 12/07/2022   HDL 60 12/07/2022   LDLCALC 108 (H) 12/07/2022   TRIG 105 12/07/2022   CHOLHDL 3.1 12/07/2022   Last hemoglobin A1c Lab Results  Component Value Date   HGBA1C 7.4 (H) 12/07/2022   Last thyroid functions Lab Results  Component Value Date   TSH 2.110 12/07/2022   Last vitamin D Lab Results  Component Value Date   VD25OH 31.7 12/07/2022   Last vitamin B12 and  Folate Lab Results  Component Value Date   VITAMINB12 808 12/07/2022   FOLATE 12.3 12/07/2022   The  10-year ASCVD risk score (Arnett DK, et al., 2019) is: 20.6%    Assessment & Plan:   Problem List Items Addressed This Visit       Essential hypertension   BP today is 138/63.  Lisinopril was increased to 40 mg daily at her last appointment.  She is additionally prescribed HCTZ 12.5 mg daily.  Home readings are largely within goal (< 130/80).  No medication changes are indicated at this time.      OSA on CPAP   She endorses nightly compliance with CPAP and requests CPAP supplies. Will follow-up with DME company.       Type 2 diabetes mellitus with diabetic polyneuropathy (HCC)   A1c 7.4 on labs from June.  Actos was increased to 30 mg daily at her last appointment.  She is additionally prescribed metformin 1000 mg twice daily and glimepiride 4 mg daily.  She checks her blood sugar daily and notes that readings are consistently lower than 3 months ago.  She has also established care with medical nutrition therapy and feels that it has been beneficial. -Repeat A1c ordered today      Dry cough   Tessalon Perles added for as needed cough relief      Need for pneumococcal 20-valent conjugate vaccination   PCV 20 administered today      Return in about 3 months (around 09/15/2023).   Billie Lade, MD

## 2023-06-17 NOTE — Assessment & Plan Note (Signed)
BP today is 138/63.  Lisinopril was increased to 40 mg daily at her last appointment.  She is additionally prescribed HCTZ 12.5 mg daily.  Home readings are largely within goal (< 130/80).  No medication changes are indicated at this time.

## 2023-06-17 NOTE — Assessment & Plan Note (Signed)
PCV 20 administered today 

## 2023-06-18 LAB — BASIC METABOLIC PANEL
BUN/Creatinine Ratio: 19 (ref 12–28)
BUN: 16 mg/dL (ref 8–27)
CO2: 19 mmol/L — ABNORMAL LOW (ref 20–29)
Calcium: 8.9 mg/dL (ref 8.7–10.3)
Chloride: 107 mmol/L — ABNORMAL HIGH (ref 96–106)
Creatinine, Ser: 0.84 mg/dL (ref 0.57–1.00)
Glucose: 116 mg/dL — ABNORMAL HIGH (ref 70–99)
Potassium: 4.4 mmol/L (ref 3.5–5.2)
Sodium: 142 mmol/L (ref 134–144)
eGFR: 76 mL/min/{1.73_m2} (ref >59)

## 2023-06-18 LAB — HEMOGLOBIN A1C
Est. average glucose Bld gHb Est-mCnc: 154 mg/dL
Hgb A1c MFr Bld: 7 % — ABNORMAL HIGH (ref 4.8–5.6)

## 2023-07-09 ENCOUNTER — Encounter: Payer: Self-pay | Admitting: Nutrition

## 2023-07-31 DIAGNOSIS — Z1283 Encounter for screening for malignant neoplasm of skin: Secondary | ICD-10-CM | POA: Diagnosis not present

## 2023-07-31 DIAGNOSIS — Z85820 Personal history of malignant melanoma of skin: Secondary | ICD-10-CM | POA: Diagnosis not present

## 2023-07-31 DIAGNOSIS — D485 Neoplasm of uncertain behavior of skin: Secondary | ICD-10-CM | POA: Diagnosis not present

## 2023-07-31 DIAGNOSIS — D225 Melanocytic nevi of trunk: Secondary | ICD-10-CM | POA: Diagnosis not present

## 2023-07-31 DIAGNOSIS — Z08 Encounter for follow-up examination after completed treatment for malignant neoplasm: Secondary | ICD-10-CM | POA: Diagnosis not present

## 2023-08-08 ENCOUNTER — Ambulatory Visit: Payer: Medicare HMO | Admitting: Obstetrics & Gynecology

## 2023-08-08 ENCOUNTER — Encounter: Payer: Self-pay | Admitting: Obstetrics & Gynecology

## 2023-08-08 VITALS — BP 111/72 | HR 86 | Ht 67.0 in | Wt 296.0 lb

## 2023-08-08 DIAGNOSIS — Z6841 Body Mass Index (BMI) 40.0 and over, adult: Secondary | ICD-10-CM | POA: Diagnosis not present

## 2023-08-08 DIAGNOSIS — N950 Postmenopausal bleeding: Secondary | ICD-10-CM | POA: Diagnosis not present

## 2023-08-08 MED ORDER — NORETHINDRONE ACETATE 5 MG PO TABS: 5.0000 mg | ORAL_TABLET | Freq: Every day | ORAL | 3 refills | Status: AC

## 2023-08-08 MED ORDER — NORETHINDRONE ACETATE 5 MG PO TABS: 5.0000 mg | ORAL_TABLET | Freq: Every day | ORAL | 0 refills | Status: DC

## 2023-08-08 NOTE — Progress Notes (Signed)
GYN VISIT Patient name: [name] MRN [id number]  Date of birth: Jun 11, 1956 Chief Complaint:   Follow-up  History of Present Illness:   [name] is a 68 y.o. G1P0010 PM female being seen today for follow up regarding:  Postmenopausal bleeding: In review, pt seen in Aug 2024 due to postmenopausal bleeding.  Bleeding was mostly pink to mucous-like and had been going on for about a ayear.  Initial EMB was negative; however when she returned for US/SHG.  At that visit, there was moderate BRB from within the uterus noted on exam.    US/SHG showed: Concerning endometrial mass.  Repeat EMB was again negative.  Since the patient wished to avoid surgical intervention, pt was sent for MRI, which revealed no abnormalities.  While the bleeding had decreased, since she had still noted BRB and spotting, she was started on oral progestin.  Since starting taking around December she has not had any vaginal bleeding, but on occasion may see some light pink spotting on the toilet paper.  States this has only happened 1-2x per day.  She reports that she feels better now than she has in the past.  Also started going to an exercise class, which she really likes.  She has noted some diarrhea- previously struggled with constipation.  She is not sure if this is due to the progestin or something else.  Otherwise doing well and reports no acute complaints  No LMP recorded. Patient is postmenopausal.    Review of Systems:   Pertinent items are noted in HPI Denies fever/chills, dizziness, headaches, visual disturbances, fatigue, shortness of breath, chest pain, abdominal pain, vomiting, no problems with urination, or intercourse unless otherwise stated above.  Pertinent History Reviewed:   Past Surgical History:  Procedure Laterality Date   ANAL FISSURE REPAIR  06/25/2010   Dr. Lovell Sheehan   COLONOSCOPY  01/29/2003   NUR: Five small polyps that were ablated/Tiny diverticula at sigmoid colon    COLONOSCOPY  03/03/2010   ZHY:QMVH canal papilla tender anal canal otherwise normal   COLONOSCOPY N/A 06/21/2014   Procedure: COLONOSCOPY;  Surgeon: Corbin Ade, MD;  [facility];  Service: Endoscopy;  Laterality: N/A;  745AM   COLONOSCOPY N/A 10/19/2020   Procedure: COLONOSCOPY;  Surgeon: Corbin Ade, MD;  [facility];  Service: Endoscopy;  Laterality: N/A;  ASA I / II / AM procedure   INCISE AND DRAIN ABCESS     melonoma removed     POLYPECTOMY  10/19/2020   Procedure: POLYPECTOMY;  Surgeon: Corbin Ade, MD;  [facility];  Service: Endoscopy;;    Past Medical History:  Diagnosis Date   Depression    Diabetes mellitus without complication (HCC)    Hypercholesterolemia    Melanoma (HCC)    Obesity    Obstructive sleep apnea on CPAP    Reviewed problem list, medications and allergies. Physical Assessment:   Vitals:   08/08/23 1143  BP: 111/72  Pulse: 86  Weight: 296 lb (134.3 kg)  Height: 5\' 7"  (1.702 m)  Body mass index is 46.36 kg/m.       Physical Examination:   General appearance: alert, well appearing, and in no distress  Psych: mood appropriate, normal affect  Skin: warm & dry   Cardiovascular: normal heart rate noted  Respiratory: normal respiratory effort, no distress  Abdomen: obese, soft, non-tender, no rebound, no guarding  Pelvic: examination not indicated  Extremities: no calf tenderness bilaterally  Chaperone:  N/A    Assessment & Plan:  1) postmenopausal bleeding -Significant improvement with progestin -Will plan to continue -Follow-up for yearly check-in  Meds ordered this encounter  Medications   DISCONTD: norethindrone (AYGESTIN) 5 MG tablet    Sig: Take 1 tablet (5 mg total) by mouth daily.    Dispense:  90 tablet    Refill:  0   norethindrone (AYGESTIN) 5 MG tablet    Sig: Take 1 tablet (5 mg total) by mouth daily.    Dispense:  90 tablet    Refill:  3      Return for Dec 2025 annual  check in.   [name], DO Attending Obstetrician & Gynecologist, [facility] for [employer], [facility]

## 2023-08-12 ENCOUNTER — Other Ambulatory Visit: Payer: Self-pay | Admitting: Internal Medicine

## 2023-08-12 MED ORDER — HYDROCHLOROTHIAZIDE 12.5 MG PO TABS: 12.5000 mg | ORAL_TABLET | Freq: Every morning | ORAL | 0 refills | Status: DC

## 2023-08-12 NOTE — Telephone Encounter (Signed)
Copied from CRM [id number]. Topic: Clinical - Medication Refill >> Aug 12, 2023 12:55 PM Fuller Mandril wrote: Most Recent Primary Care Visit:  Provider: Christel Mormon E  Department: [facility]  Visit Type: OFFICE VISIT  Date: 06/17/2023  Medication: hydrochlorothiazide (HYDRODIURIL) 12.5 MG tablet  Has the patient contacted their pharmacy? Yes (Agent: If no, request that the patient contact the pharmacy for the refill. If patient does not wish to contact the pharmacy document the reason why and proceed with request.) (Agent: If yes, when and what did the pharmacy advise?) Pharmacy unable to refill different provider  Is this the correct pharmacy for this prescription? Yes If no, delete pharmacy and type the correct one.  This is the patient's preferred pharmacy:  [facility] - [place], [place] - [address] [place] [address] Phone: [phone] Fax: [phone]   Has the prescription been filled recently? No  Is the patient out of the medication? Yes  Has the patient been seen for an appointment in the last year OR does the patient have an upcoming appointment? Yes  Can we respond through MyChart? No  Agent: Please be advised that Rx refills may take up to 3 business days. We ask that you follow-up with your pharmacy.

## 2023-09-10 ENCOUNTER — Other Ambulatory Visit: Payer: Self-pay | Admitting: Internal Medicine

## 2023-09-16 ENCOUNTER — Encounter: Payer: Self-pay | Admitting: Internal Medicine

## 2023-09-16 ENCOUNTER — Telehealth: Payer: Self-pay

## 2023-09-16 ENCOUNTER — Ambulatory Visit: Payer: Medicare HMO | Admitting: Internal Medicine

## 2023-09-16 VITALS — BP 133/79 | HR 84 | Ht 67.0 in | Wt 292.4 lb

## 2023-09-16 DIAGNOSIS — I10 Essential (primary) hypertension: Secondary | ICD-10-CM

## 2023-09-16 DIAGNOSIS — F3342 Major depressive disorder, recurrent, in full remission: Secondary | ICD-10-CM | POA: Diagnosis not present

## 2023-09-16 DIAGNOSIS — E1169 Type 2 diabetes mellitus with other specified complication: Secondary | ICD-10-CM

## 2023-09-16 DIAGNOSIS — R197 Diarrhea, unspecified: Secondary | ICD-10-CM | POA: Diagnosis not present

## 2023-09-16 DIAGNOSIS — Z6841 Body Mass Index (BMI) 40.0 and over, adult: Secondary | ICD-10-CM | POA: Diagnosis not present

## 2023-09-16 DIAGNOSIS — G4733 Obstructive sleep apnea (adult) (pediatric): Secondary | ICD-10-CM | POA: Diagnosis not present

## 2023-09-16 DIAGNOSIS — E785 Hyperlipidemia, unspecified: Secondary | ICD-10-CM | POA: Diagnosis not present

## 2023-09-16 DIAGNOSIS — E1142 Type 2 diabetes mellitus with diabetic polyneuropathy: Secondary | ICD-10-CM | POA: Diagnosis not present

## 2023-09-16 DIAGNOSIS — Z7984 Long term (current) use of oral hypoglycemic drugs: Secondary | ICD-10-CM

## 2023-09-16 MED ORDER — GLIMEPIRIDE 4 MG PO TABS: 4.0000 mg | ORAL_TABLET | Freq: Every day | ORAL | 1 refills | Status: DC

## 2023-09-16 MED ORDER — HYDROCHLOROTHIAZIDE 12.5 MG PO TABS: 12.5000 mg | ORAL_TABLET | Freq: Every morning | ORAL | 0 refills | Status: DC

## 2023-09-16 MED ORDER — TIRZEPATIDE 2.5 MG/0.5ML ~~LOC~~ SOAJ
2.5000 mg | SUBCUTANEOUS | 0 refills | Status: DC
Start: 2023-09-16 — End: 2023-09-17

## 2023-09-16 MED ORDER — ATORVASTATIN CALCIUM 80 MG PO TABS: 80.0000 mg | ORAL_TABLET | Freq: Every morning | ORAL | 3 refills | Status: AC

## 2023-09-16 MED ORDER — CITALOPRAM HYDROBROMIDE 40 MG PO TABS: 40.0000 mg | ORAL_TABLET | Freq: Every morning | ORAL | 3 refills | Status: AC

## 2023-09-16 NOTE — Assessment & Plan Note (Signed)
 She endorses a 51-month history of nonbloody diarrhea as described above.  There were no changes to her medications or diet at the onset of diarrhea.  Treatment options were reviewed.  For now, she is in agreement with discontinuing metformin in case this is contributing to diarrhea.  We also discussed increasing daily fiber intake.  She will return to care if symptoms worsen or fail to improve.  Repeat labs at follow-up in 3 months.

## 2023-09-16 NOTE — Telephone Encounter (Signed)
 Attempted to reach matt fuller with [facility]illy [phone], will try again

## 2023-09-16 NOTE — Telephone Encounter (Signed)
 Copied from CRM [id number]. Topic: Clinical - Prescription Issue >> Sep 16, 2023 10:48 AM [name] wrote: Reason for CRM: The patient states that she went to pick up her tirzepatide [facility]) 2.5 MG/0.5ML Pen and it $240 and she is unable to afford that. She is requesting to see if Dr. Durwin Nora recommends her continuing her Metformin.  Preferred Pharmacy:  [facility] - [place], [place] - [address] [place], [place] [address] Phone: [phone]  Fax: [phone]

## 2023-09-16 NOTE — Assessment & Plan Note (Signed)
 Remains adequately controlled on current antihypertensive regimen.  No medication changes are indicated today.

## 2023-09-16 NOTE — Assessment & Plan Note (Signed)
 Lipid panel last updated in June 2024.  Total cholesterol 187 and LDL 108.  She is currently prescribed atorvastatin 80 mg daily and Zetia 10 mg daily was added at her last appointment.  Repeat lipid panel at follow-up in 3 months.

## 2023-09-16 NOTE — Patient Instructions (Signed)
 It was a pleasure to see you today.  Thank you for giving [place] the opportunity to be involved in your care.  Below is a brief recap of your visit and next steps.  We will plan to see you again in 3 months.  Summary Discontinue metformin Start Mounjaro 2.5 mg weekly. We will plan to increase the dose to 5 mg weekly after 4 injections. Increase daily fiber intake Follow up in 3 months

## 2023-09-16 NOTE — Assessment & Plan Note (Signed)
 A1c 7.0 on labs from December 2023.  She is currently prescribed metformin 1000 mg twice daily, glimepiride 4 mg daily, and Actos 30 mg daily.  Her acute concern today is chronic diarrhea x 6 months.  Through shared decision making, we will discontinue metformin in case this is contributing to diarrhea.  We will also start Mounjaro 2.5 mg daily.  Repeat A1c at follow-up in 3 months.

## 2023-09-16 NOTE — Progress Notes (Signed)
 Established Patient Office Visit  Subjective   Patient ID: [name], female    DOB: 02-09-1956  Age: 68 y.o. MRN: [id number]  Chief Complaint  Patient presents with   Care Management    Pt states her top lip swelled up yesterday evening, states she ate shrimp and had cocktail sauce that burnt her mouth up and was thinking that was it.    [name] returns to care today for routine follow-up.  She was last evaluated by me in December 2024.  Tessalon Perles were added for cough relief, repeat labs ordered, and 37-month follow-up arranged.  She has been seen by gynecology for follow-up in the setting of postmenopausal bleeding in the interim.  There have otherwise been no acute interval events. Today she reports feeling fairly well. She endorses a 56-month history of non-bloody diarrhea. She previously had to take stool softeners 3 times daily to have bowel movements but stopped taking them when she began to have diarrhea.   Past Medical History:  Diagnosis Date   Depression    Diabetes mellitus without complication (HCC)    Hypercholesterolemia    Melanoma (HCC)    Obesity    Obstructive sleep apnea on CPAP    Past Surgical History:  Procedure Laterality Date   ANAL FISSURE REPAIR  06/25/2010   Dr. Lovell Sheehan   COLONOSCOPY  01/29/2003   NUR: Five small polyps that were ablated/Tiny diverticula at sigmoid colon   COLONOSCOPY  03/03/2010   OZH:YQMV canal papilla tender anal canal otherwise normal   COLONOSCOPY N/A 06/21/2014   Procedure: COLONOSCOPY;  Surgeon: Corbin Ade, MD;  [facility];  Service: Endoscopy;  Laterality: N/A;  745AM   COLONOSCOPY N/A 10/19/2020   Procedure: COLONOSCOPY;  Surgeon: Corbin Ade, MD;  [facility];  Service: Endoscopy;  Laterality: N/A;  ASA I / II / AM procedure   INCISE AND DRAIN ABCESS     melonoma removed     POLYPECTOMY  10/19/2020   Procedure: POLYPECTOMY;  Surgeon: Corbin Ade, MD;  [facility];  Service: Endoscopy;;   Social History   Tobacco Use   Smoking status: Former    Current packs/day: 0.00    Types: Cigarettes    Quit date: 02/26/2000    Years since quitting: 23.5   Smokeless tobacco: Never  Vaping Use   Vaping status: Never Used  Substance Use Topics   Alcohol use: No    Alcohol/week: 0.0 standard drinks of alcohol   Drug use: No   Family History  Problem Relation Age of Onset   Stroke Mother    Diabetes Brother    Cancer Brother        bone marrow   Colon cancer Neg Hx    Allergies  Allergen Reactions   Clomipramine Hcl Other (See Comments)    Caused hot flashes   Elemental Sulfur Hives   Sulfa Antibiotics Hives   Review of Systems  Constitutional:  Negative for chills and fever.  HENT:  Negative for sore throat.   Respiratory:  Negative for cough and shortness of breath.   Cardiovascular:  Negative for chest pain, palpitations and leg swelling.  Gastrointestinal:  Positive for diarrhea (non-bloody x 6 months). Negative for abdominal pain, blood in stool, constipation, nausea and vomiting.  Genitourinary:  Negative for dysuria and hematuria.  Musculoskeletal:  Negative for myalgias.  Skin:  Negative for itching and rash.  Neurological:  Negative for dizziness and  headaches.  Psychiatric/Behavioral:  Negative for depression and suicidal ideas.      Objective:     BP 133/79 (BP Location: Left Arm, Patient Position: Sitting, Cuff Size: Large)   Pulse 84   Ht 5\' 7"  (1.702 m)   Wt 292 lb 6.4 oz (132.6 kg)   SpO2 94%   BMI 45.80 kg/m  BP Readings from Last 3 Encounters:  09/16/23 133/79  08/08/23 111/72  06/17/23 138/63   Physical Exam Vitals reviewed.  Constitutional:      General: She is not in acute distress.    Appearance: Normal appearance. She is obese. She is not toxic-appearing.  HENT:     Head: Normocephalic and atraumatic.     Right Ear: External ear normal.     Left Ear: External ear normal.     Nose: Nose normal. No  congestion or rhinorrhea.     Mouth/Throat:     Mouth: Mucous membranes are moist.     Pharynx: Oropharynx is clear. No oropharyngeal exudate or posterior oropharyngeal erythema.  Eyes:     General: No scleral icterus.    Extraocular Movements: Extraocular movements intact.     Conjunctiva/sclera: Conjunctivae normal.     Pupils: Pupils are equal, round, and reactive to light.  Cardiovascular:     Rate and Rhythm: Normal rate and regular rhythm.     Pulses: Normal pulses.     Heart sounds: Normal heart sounds. No murmur heard.    No friction rub. No gallop.  Pulmonary:     Effort: Pulmonary effort is normal.     Breath sounds: Normal breath sounds. No wheezing, rhonchi or rales.  Abdominal:     General: Abdomen is flat. Bowel sounds are normal. There is no distension.     Palpations: Abdomen is soft.     Tenderness: There is no abdominal tenderness.  Musculoskeletal:        General: No swelling. Normal range of motion.     Cervical back: Normal range of motion.     Right lower leg: No edema.     Left lower leg: No edema.  Lymphadenopathy:     Cervical: No cervical adenopathy.  Skin:    General: Skin is warm and dry.     Capillary Refill: Capillary refill takes less than 2 seconds.     Coloration: Skin is not jaundiced.  Neurological:     General: No focal deficit present.     Mental Status: She is alert and oriented to person, place, and time.  Psychiatric:        Mood and Affect: Mood normal.        Behavior: Behavior normal.   Last CBC Lab Results  Component Value Date   WBC 5.5 12/07/2022   HGB 13.1 12/07/2022   HCT 40.6 12/07/2022   MCV 92 12/07/2022   MCH 29.6 12/07/2022   RDW 13.6 12/07/2022   PLT 233 12/07/2022   Last metabolic panel Lab Results  Component Value Date   GLUCOSE 116 (H) 06/17/2023   NA 142 06/17/2023   K 4.4 06/17/2023   CL 107 (H) 06/17/2023   CO2 19 (L) 06/17/2023   BUN 16 06/17/2023   CREATININE 0.84 06/17/2023   EGFR 76 06/17/2023    CALCIUM 8.9 06/17/2023   PROT 6.3 12/07/2022   ALBUMIN 4.1 12/07/2022   LABGLOB 2.2 12/07/2022   AGRATIO 1.9 12/07/2022   BILITOT 0.3 12/07/2022   ALKPHOS 72 12/07/2022   AST 10 12/07/2022   ALT  12 12/07/2022   ANIONGAP 10 01/23/2020   Last lipids Lab Results  Component Value Date   CHOL 187 12/07/2022   HDL 60 12/07/2022   LDLCALC 108 (H) 12/07/2022   TRIG 105 12/07/2022   CHOLHDL 3.1 12/07/2022   Last hemoglobin A1c Lab Results  Component Value Date   HGBA1C 7.0 (H) 06/17/2023   Last thyroid functions Lab Results  Component Value Date   TSH 2.110 12/07/2022   Last vitamin D Lab Results  Component Value Date   VD25OH 31.7 12/07/2022   Last vitamin B12 and Folate Lab Results  Component Value Date   VITAMINB12 808 12/07/2022   FOLATE 12.3 12/07/2022   The 10-year ASCVD risk score (Arnett DK, et al., 2019) is: 19.3%    Assessment & Plan:   Problem List Items Addressed This Visit       Essential hypertension   Remains adequately controlled on current antihypertensive regimen.  No medication changes are indicated today.      Type 2 diabetes mellitus with diabetic polyneuropathy (HCC) - Primary   A1c 7.0 on labs from December 2023.  She is currently prescribed metformin 1000 mg twice daily, glimepiride 4 mg daily, and Actos 30 mg daily.  Her acute concern today is chronic diarrhea x 6 months.  Through shared decision making, we will discontinue metformin in case this is contributing to diarrhea.  We will also start Mounjaro 2.5 mg daily.  Repeat A1c at follow-up in 3 months.      Hyperlipidemia associated with type 2 diabetes mellitus (HCC)   Lipid panel last updated in June 2024.  Total cholesterol 187 and LDL 108.  She is currently prescribed atorvastatin 80 mg daily and Zetia 10 mg daily was added at her last appointment.  Repeat lipid panel at follow-up in 3 months.      MDD (major depressive disorder)   Mood remains stable with citalopram 40 mg daily.   Refill provided today.      Diarrhea   She endorses a 47-month history of nonbloody diarrhea as described above.  There were no changes to her medications or diet at the onset of diarrhea.  Treatment options were reviewed.  For now, she is in agreement with discontinuing metformin in case this is contributing to diarrhea.  We also discussed increasing daily fiber intake.  She will return to care if symptoms worsen or fail to improve.  Repeat labs at follow-up in 3 months.       Return in about 3 months (around 12/17/2023).    Billie Lade, MD

## 2023-09-16 NOTE — Assessment & Plan Note (Signed)
 Mood remains stable with citalopram 40 mg daily.  Refill provided today.

## 2023-09-17 ENCOUNTER — Telehealth: Payer: Self-pay | Admitting: Internal Medicine

## 2023-09-17 DIAGNOSIS — E1142 Type 2 diabetes mellitus with diabetic polyneuropathy: Secondary | ICD-10-CM

## 2023-09-17 MED ORDER — OZEMPIC (0.25 OR 0.5 MG/DOSE) 2 MG/3ML ~~LOC~~ SOPN: 0.2500 mg | PEN_INJECTOR | SUBCUTANEOUS | 0 refills | Status: DC

## 2023-09-17 NOTE — Telephone Encounter (Signed)
 Copied from CRM [id number]. Topic: Clinical - Prescription Issue >> Sep 16, 2023 10:48 AM Elle L wrote: Reason for CRM: The patient states that she went to pick up her tirzepatide [facility]) 2.5 MG/0.5ML Pen and it $240 and she is unable to afford that. She is requesting to see if Dr. Durwin Nora recommends her continuing her Metformin.  Preferred Pharmacy:  [facility] - [place], [place] - [address] [place], [place] [address] Phone: [phone]  Fax: [phone] >> Sep 17, 2023  2:46 PM Marlow Baars wrote: The patient called back checking on the status of what her provider wants her to do as the Greggory Keen was just to expensive. I see Herbert Seta was trying to check with Matt at [place] to see if that can get the cost down. Please assist patient further as soon as possible because she wants to know if she needs to stay on the Metformin or if anything else can be done.

## 2023-09-17 NOTE — Telephone Encounter (Signed)
 Called patient for an diabetic eye exam, she goes to my eye doctor Dr Charise Killian in [place]

## 2023-09-17 NOTE — Telephone Encounter (Signed)
 Copied from CRM [id number]. Topic: Clinical - Prescription Issue >> Sep 16, 2023 10:48 AM Elle L wrote: Reason for CRM: The patient states that she went to pick up her tirzepatide [facility]) 2.5 MG/0.5ML Pen and it $240 and she is unable to afford that. She is requesting to see if Dr. Durwin Nora recommends her continuing her Metformin.  Preferred Pharmacy:  [facility] - [place], [place] - [address] [place], [place] [address] Phone: [phone]  Fax: [phone]

## 2023-09-18 NOTE — Telephone Encounter (Signed)
 Pt informed

## 2023-10-08 ENCOUNTER — Ambulatory Visit: Payer: Medicare HMO | Admitting: Nutrition

## 2023-10-16 ENCOUNTER — Encounter: Payer: Self-pay | Admitting: Nutrition

## 2023-10-16 ENCOUNTER — Encounter: Attending: Internal Medicine | Admitting: Nutrition

## 2023-10-16 VITALS — Ht 67.0 in | Wt 287.6 lb

## 2023-10-16 DIAGNOSIS — E1142 Type 2 diabetes mellitus with diabetic polyneuropathy: Secondary | ICD-10-CM | POA: Insufficient documentation

## 2023-10-16 DIAGNOSIS — I10 Essential (primary) hypertension: Secondary | ICD-10-CM | POA: Insufficient documentation

## 2023-10-16 NOTE — Progress Notes (Signed)
 Medical Nutrition Therapy  Appointment Start time:  [phone] Appointment End time:  0915  Primary concerns today: Dm Type 2, Hyperlipidemia, HTN, Morbid obesity  Referral diagnosis:E 11.69, E78, I10, E66.01 Preferred learning style: NO Preference  Learning readiness: Ready    NUTRITION ASSESSMENT Follow up She is proud her BP is better and her weight is coming down. Stopped Metformin -diarrhea much better. She has lost 15 lbs  Has been on Ozempic  for the last month. Going to [employer] twice a week and walking a mile 3 times per week. Changes in eating habits: Working on cutting out sodium foods Drinking more water . Still drinking 2 sodas per day FBS 156 mg/dl. BP 112/78 mg/dl. Been eating some Healthy Choice meals  Clinical Medical Hx:  Past Medical History:  Diagnosis Date   Depression    Diabetes mellitus without complication (HCC)    Hypercholesterolemia    Melanoma (HCC)    Obesity    Obstructive sleep apnea on CPAP     Medications:  Current Outpatient Medications on File Prior to Visit  Medication Sig Dispense Refill   albuterol (VENTOLIN HFA) 108 (90 Base) MCG/ACT inhaler  (Patient not taking: Reported on 09/16/2023)     atorvastatin  (LIPITOR) 80 MG tablet Take 1 tablet (80 mg total) by mouth in the morning. 90 tablet 3   cholecalciferol (VITAMIN D ) 25 MCG (1000 UNIT) tablet Take 1,000 Units by mouth in the morning.     citalopram  (CELEXA ) 40 MG tablet Take 1 tablet (40 mg total) by mouth in the morning. 90 tablet 3   docusate sodium (COLACE) 100 MG capsule Take 200 mg by mouth every evening.     ezetimibe  (ZETIA ) 10 MG tablet Take 1 tablet (10 mg total) by mouth daily. 90 tablet 3   glimepiride  (AMARYL ) 4 MG tablet Take 1 tablet (4 mg total) by mouth daily with breakfast. 90 tablet 1   hydrochlorothiazide  (HYDRODIURIL ) 12.5 MG tablet Take 1 tablet (12.5 mg total) by mouth every morning. 30 tablet 0   lisinopril  (ZESTRIL ) 40 MG tablet Take 1 tablet (40 mg total) by  mouth daily. 90 tablet 3   norethindrone  (AYGESTIN ) 5 MG tablet Take 1 tablet (5 mg total) by mouth daily. 90 tablet 3   pioglitazone  (ACTOS ) 30 MG tablet Take 1 tablet (30 mg total) by mouth daily. 90 tablet 1   Semaglutide ,0.25 or 0.5MG /DOS, (OZEMPIC , 0.25 OR 0.5 MG/DOSE,) 2 MG/3ML SOPN Inject 0.25 mg into the skin once a week. 3 mL 0   vitamin B-12 (CYANOCOBALAMIN) 1000 MCG tablet Take 1,000 mcg by mouth in the morning.     No current facility-administered medications on file prior to visit.    Labs:  Lab Results  Component Value Date   HGBA1C 7.0 (H) 06/17/2023      Latest Ref Rng & Units 06/17/2023    9:16 AM 12/07/2022    9:49 AM 01/23/2020    2:12 PM  CMP  Glucose 70 - 99 mg/dL 960  454  97   BUN 8 - 27 mg/dL 16  12  17    Creatinine 0.57 - 1.00 mg/dL 0.98  1.19  1.47   Sodium 134 - 144 mmol/L 142  142  140   Potassium 3.5 - 5.2 mmol/L 4.4  4.0  3.5   Chloride 96 - 106 mmol/L 107  105  109   CO2 20 - 29 mmol/L 19  22  21    Calcium  8.7 - 10.3 mg/dL 8.9  9.3  9.0  Total Protein 6.0 - 8.5 g/dL  6.3    Total Bilirubin 0.0 - 1.2 mg/dL  0.3    Alkaline Phos 44 - 121 IU/L  72    AST 0 - 40 IU/L  10    ALT 0 - 32 IU/L  12     Lipid Panel     Component Value Date/Time   CHOL 187 12/07/2022 0949   TRIG 105 12/07/2022 0949   HDL 60 12/07/2022 0949   CHOLHDL 3.1 12/07/2022 0949   LDLCALC 108 (H) 12/07/2022 0949   LABVLDL 19 12/07/2022 0949    Notable Signs/Symptoms: fatigue, craves sweets, increased urination  Lifestyle & Dietary Hx Lives by herself  Estimated daily fluid intake: 60 oz- Diet Mt Dew-3 per day Supplements:  Sleep: poor  Stress / self-care: some Current average weekly physical activity: ADL  24-Hr Dietary Recall Nabs and fruit.  Doesn't drink water   Estimated Energy Needs Calories: 1200 Carbohydrate: 135g Protein: 90g Fat: 33g   NUTRITION DIAGNOSIS  NB-1.1 Food and nutrition-related knowledge deficit As related to high calorie high carb  processed food diet.  As evidenced by DM Type 2 A1C 7.4%, Morbid obesity, HTN and Hyperlipidemai.   NUTRITION INTERVENTION  Nutrition education (E-1) on the following topics:  Nutrition and Diabetes education provided on My Plate, CHO counting, meal planning, portion sizes, timing of meals, avoiding snacks between meals unless having a low blood sugar, target ranges for A1C and blood sugars, signs/symptoms and treatment of hyper/hypoglycemia, monitoring blood sugars, taking medications as prescribed, benefits of exercising 30 minutes per day and prevention of complications of DM.  Lifestyle Medicine  - Whole Food, Plant Predominant Nutrition is highly recommended: Eat Plenty of vegetables, Mushrooms, fruits, Legumes, Whole Grains, Nuts, seeds in lieu of processed meats, processed snacks/pastries red meat, poultry, eggs.    -It is better to avoid simple carbohydrates including: Cakes, Sweet Desserts, Ice Cream, Soda (diet and regular), Sweet Tea, Candies, Chips, Cookies, Store Bought Juices, Alcohol in Excess of  1-2 drinks a day, Lemonade,  Artificial Sweeteners, Doughnuts, Coffee Creamers, "Sugar-free" Products, etc, etc.  This is not a complete list.....  Exercise: If you are able: 30 -60 minutes a day ,4 days a week, or 150 minutes a week.  The longer the better.  Combine stretch, strength, and aerobic activities.  If you were told in the past that you have high risk for cardiovascular diseases, you may seek evaluation by your heart doctor prior to initiating moderate to intense exercise programs.   Handouts Provided Include  Know your numbers Lifestyle Medicine handouts  Learning Style & Readiness for Change Teaching method utilized: Visual & Auditory  Demonstrated degree of understanding via: Teach Back  Barriers to learning/adherence to lifestyle change: motivation to make lifestyle changes  Goals Established by Pt Goals Keep up the good work! Choose Special K cereal for breakfast  instead of nabs Keep walking and working out  MONITORING & EVALUATION Dietary intake, weekly physical activity, and weight and BS in 3 month.  Next Steps  Patient is to work on planning meals and cooking at home.Aaron Aas

## 2023-10-16 NOTE — Patient Instructions (Addendum)
 Goals Keep up the good work! Choose Special K cereal for breakfast instead of nabs Keep walking and working out

## 2023-11-05 DIAGNOSIS — D225 Melanocytic nevi of trunk: Secondary | ICD-10-CM | POA: Diagnosis not present

## 2023-11-05 DIAGNOSIS — Z85820 Personal history of malignant melanoma of skin: Secondary | ICD-10-CM | POA: Diagnosis not present

## 2023-11-05 DIAGNOSIS — Z1283 Encounter for screening for malignant neoplasm of skin: Secondary | ICD-10-CM | POA: Diagnosis not present

## 2023-11-05 DIAGNOSIS — Z08 Encounter for follow-up examination after completed treatment for malignant neoplasm: Secondary | ICD-10-CM | POA: Diagnosis not present

## 2023-11-07 ENCOUNTER — Telehealth: Payer: Self-pay | Admitting: Internal Medicine

## 2023-11-07 ENCOUNTER — Other Ambulatory Visit: Payer: Self-pay | Admitting: Internal Medicine

## 2023-11-07 DIAGNOSIS — E1142 Type 2 diabetes mellitus with diabetic polyneuropathy: Secondary | ICD-10-CM

## 2023-11-07 NOTE — Telephone Encounter (Signed)
 Copied from CRM [id number]. Topic: Clinical - Medication Refill >> Nov 07, 2023 11:32 AM Tiffany S wrote: Medication: Semaglutide ,0.25 or 0.5MG /DOS, (OZEMPIC , 0.25 OR 0.5 MG/DOSE,) 2 MG/3ML SOPN [[id number] Increased dose to 0.5mg  Has the patient contacted their pharmacy? Yes (Agent: If no, request that the patient contact the pharmacy for the refill. If patient does not wish to contact the pharmacy document the reason why and proceed with request.) (Agent: If yes, when and what did the pharmacy advise?)  This is the patient's preferred pharmacy:  [facility] - [place], [place] - [address] [place] [address] Phone: [phone] Fax: [phone]  Is this the correct pharmacy for this prescription? Yes If no, delete pharmacy and type the correct one.   Has the prescription been filled recently? Yes  Is the patient out of the medication? Yes  Has the patient been seen for an appointment in the last year OR does the patient have an upcoming appointment? Yes  Can we respond through MyChart? Yes  Agent: Please be advised that Rx refills may take up to 3 business days. We ask that you follow-up with your pharmacy.

## 2023-11-11 ENCOUNTER — Other Ambulatory Visit: Payer: Self-pay | Admitting: Internal Medicine

## 2023-11-11 DIAGNOSIS — E1142 Type 2 diabetes mellitus with diabetic polyneuropathy: Secondary | ICD-10-CM

## 2023-11-11 MED ORDER — OZEMPIC (0.25 OR 0.5 MG/DOSE) 2 MG/3ML ~~LOC~~ SOPN: 0.5000 mg | PEN_INJECTOR | SUBCUTANEOUS | 0 refills | Status: DC

## 2023-12-05 ENCOUNTER — Other Ambulatory Visit: Payer: Self-pay | Admitting: Internal Medicine

## 2023-12-05 DIAGNOSIS — E1142 Type 2 diabetes mellitus with diabetic polyneuropathy: Secondary | ICD-10-CM

## 2023-12-12 ENCOUNTER — Other Ambulatory Visit: Payer: Self-pay | Admitting: Internal Medicine

## 2023-12-18 ENCOUNTER — Ambulatory Visit

## 2023-12-18 VITALS — BP 116/74 | HR 87 | Ht 67.0 in | Wt 286.0 lb

## 2023-12-18 DIAGNOSIS — E1169 Type 2 diabetes mellitus with other specified complication: Secondary | ICD-10-CM | POA: Diagnosis not present

## 2023-12-18 DIAGNOSIS — E1142 Type 2 diabetes mellitus with diabetic polyneuropathy: Secondary | ICD-10-CM | POA: Diagnosis not present

## 2023-12-18 DIAGNOSIS — I10 Essential (primary) hypertension: Secondary | ICD-10-CM | POA: Diagnosis not present

## 2023-12-18 DIAGNOSIS — E785 Hyperlipidemia, unspecified: Secondary | ICD-10-CM

## 2023-12-18 MED ORDER — PIOGLITAZONE HCL 30 MG PO TABS: 30.0000 mg | ORAL_TABLET | Freq: Every day | ORAL | 1 refills | Status: AC

## 2023-12-18 MED ORDER — HYDROCHLOROTHIAZIDE 12.5 MG PO TABS: 12.5000 mg | ORAL_TABLET | Freq: Every morning | ORAL | 1 refills | Status: DC

## 2023-12-18 MED ORDER — SEMAGLUTIDE (1 MG/DOSE) 4 MG/3ML ~~LOC~~ SOPN: 1.0000 mg | PEN_INJECTOR | SUBCUTANEOUS | 2 refills | Status: DC

## 2023-12-18 NOTE — Assessment & Plan Note (Signed)
 Lab Results  Component Value Date   HGBA1C 7.0 (H) 06/17/2023   HGBA1C 7.4 (H) 12/07/2022    She is currently taking, glimepiride  4 mg daily, and Actos  30 mg daily and has been on Ozempic  for the past 3 months.  She denies any nausea vomiting or constipation since starting on the Ozempic .  We will increase dose of Ozempic  to 1 mg.  Repeat labs today.  Recommend follow-up in  months or sooner if labs indicate.

## 2023-12-18 NOTE — Progress Notes (Signed)
 Established Patient Office Visit  Subjective   Patient ID: [name], female    DOB: Jun 11, 1956  Age: 68 y.o. MRN: [id number]  Chief Complaint  Patient presents with   Medical Management of Chronic Issues    3 month followup    HPI  Diabetes Mellitus Type II, Follow-up  Lab Results  Component Value Date   HGBA1C 7.0 (H) 06/17/2023   HGBA1C 7.4 (H) 12/07/2022   Wt Readings from Last 3 Encounters:  12/18/23 286 lb (129.7 kg)  10/16/23 287 lb 9.6 oz (130.5 kg)  09/16/23 292 lb 6.4 oz (132.6 kg)   Last seen for diabetes 3 months ago.  Management since then includes medication and diabetic nutritionist. She reports excellent compliance with treatment. She is not having side effects.  Symptoms: No fatigue No foot ulcerations  No appetite changes No nausea  No paresthesia of the feet  No polydipsia  No polyuria No visual disturbances   No vomiting     Home blood sugar records: fasting range: 138-167 am FBS  Episodes of hypoglycemia? No    Current insulin regiment: n/a Most Recent Eye Exam: needs cataract surgery Current exercise: cardiovascular workout on exercise equipment and walking Current diet habits: in general, a healthy diet    Pertinent Labs: Lab Results  Component Value Date   CHOL 187 12/07/2022   HDL 60 12/07/2022   LDLCALC 108 (H) 12/07/2022   TRIG 105 12/07/2022   CHOLHDL 3.1 12/07/2022   Lab Results  Component Value Date   NA 142 06/17/2023   K 4.4 06/17/2023   CREATININE 0.84 06/17/2023   EGFR 76 06/17/2023   MICRALBCREAT 6 12/07/2022     ---------------------------------------------------------------------------------------------------  Patient Active Problem List   Diagnosis Date Noted   Diarrhea 09/16/2023   Dry cough 06/17/2023   Need for pneumococcal 20-valent conjugate vaccination 06/17/2023   Morbid obesity (HCC) 03/18/2023   Routine Papanicolaou smear 01/01/2023   PMB (postmenopausal bleeding) 01/01/2023   Type 2  diabetes mellitus with diabetic polyneuropathy (HCC) 12/07/2022   Essential hypertension 12/07/2022   History of melanoma 12/07/2022   Hyperlipidemia associated with type 2 diabetes mellitus (HCC) 12/07/2022   OSA on CPAP 12/07/2022   MDD (major depressive disorder) 12/07/2022   Spotting 12/07/2022   History of colonic polyps    RECTAL BLEEDING 02/07/2010   ANAL OR RECTAL PAIN 02/07/2010   Hx of adenomatous colonic polyps 02/07/2010      ROS    Objective:     BP 116/74   Pulse 87   Ht 5' 7 (1.702 m)   Wt 286 lb (129.7 kg)   SpO2 90%   BMI 44.79 kg/m  BP Readings from Last 3 Encounters:  12/18/23 116/74  09/16/23 133/79  08/08/23 111/72   Wt Readings from Last 3 Encounters:  12/18/23 286 lb (129.7 kg)  10/16/23 287 lb 9.6 oz (130.5 kg)  09/16/23 292 lb 6.4 oz (132.6 kg)     Physical Exam Vitals and nursing note reviewed.  Constitutional:      Appearance: Normal appearance.   Eyes:     Extraocular Movements: Extraocular movements intact.     Pupils: Pupils are equal, round, and reactive to light.    Cardiovascular:     Rate and Rhythm: Normal rate and regular rhythm.  Pulmonary:     Effort: Pulmonary effort is normal.     Breath sounds: Normal breath sounds.   Musculoskeletal:     Cervical back: Normal range of motion and  neck supple.   Neurological:     Mental Status: She is alert and oriented to person, place, and time.   Psychiatric:        Mood and Affect: Mood normal.        Thought Content: Thought content normal.      No results found for any visits on 12/18/23.    The 10-year ASCVD risk score (Arnett DK, et al., 2019) is: 15%    Assessment & Plan:   Problem List Items Addressed This Visit       Cardiovascular and Mediastinum   Essential hypertension   Remains adequately controlled on current antihypertensive regimen.  No medication changes are indicated today.      Relevant Medications   hydrochlorothiazide  (HYDRODIURIL ) 12.5  MG tablet     Endocrine   Type 2 diabetes mellitus with diabetic polyneuropathy (HCC) - Primary   Lab Results  Component Value Date   HGBA1C 7.0 (H) 06/17/2023   HGBA1C 7.4 (H) 12/07/2022    She is currently taking, glimepiride  4 mg daily, and Actos  30 mg daily and has been on Ozempic  for the past 3 months.  She denies any nausea vomiting or constipation since starting on the Ozempic .  We will increase dose of Ozempic  to 1 mg.  Repeat labs today.  Recommend follow-up in  months or sooner if labs indicate.        Relevant Medications   Semaglutide , 1 MG/DOSE, 4 MG/3ML SOPN   pioglitazone  (ACTOS ) 30 MG tablet   Other Relevant Orders   CMP14+EGFR   Hemoglobin A1c   Microalbumin / creatinine urine ratio   Hyperlipidemia associated with type 2 diabetes mellitus (HCC)   She is currently prescribed atorvastatin  80 mg daily and Zetia  10 mg daily was added at her last appointment.  Repeat lipid panel at follow-up in 6 months.      Relevant Medications   Semaglutide , 1 MG/DOSE, 4 MG/3ML SOPN   pioglitazone  (ACTOS ) 30 MG tablet   hydrochlorothiazide  (HYDRODIURIL ) 12.5 MG tablet   Other Relevant Orders   CMP14+EGFR   Lipid panel    Return in about 3 months (around 03/19/2024) for chronic follow-up with PCP.    Leita Longs, FNP

## 2023-12-18 NOTE — Assessment & Plan Note (Signed)
 Remains adequately controlled on current antihypertensive regimen.  No medication changes are indicated today.

## 2023-12-18 NOTE — Assessment & Plan Note (Signed)
 She is currently prescribed atorvastatin  80 mg daily and Zetia  10 mg daily was added at her last appointment.  Repeat lipid panel at follow-up in 6 months.

## 2023-12-19 LAB — MICROALBUMIN / CREATININE URINE RATIO

## 2023-12-20 LAB — LIPID PANEL
Chol/HDL Ratio: 3.4 ratio (ref 0.0–4.4)
Cholesterol, Total: 130 mg/dL (ref 100–199)
HDL: 38 mg/dL — ABNORMAL LOW (ref >39)
LDL Chol Calc (NIH): 73 mg/dL (ref 0–99)
Triglycerides: 99 mg/dL (ref 0–149)
VLDL Cholesterol Cal: 19 mg/dL (ref 5–40)

## 2023-12-20 LAB — HEMOGLOBIN A1C
Est. average glucose Bld gHb Est-mCnc: 174 mg/dL
Hgb A1c MFr Bld: 7.7 % — ABNORMAL HIGH (ref 4.8–5.6)

## 2023-12-31 ENCOUNTER — Other Ambulatory Visit (HOSPITAL_COMMUNITY): Payer: Self-pay

## 2023-12-31 DIAGNOSIS — Z1231 Encounter for screening mammogram for malignant neoplasm of breast: Secondary | ICD-10-CM

## 2024-01-01 ENCOUNTER — Other Ambulatory Visit: Payer: Self-pay | Admitting: Internal Medicine

## 2024-01-01 DIAGNOSIS — E1142 Type 2 diabetes mellitus with diabetic polyneuropathy: Secondary | ICD-10-CM

## 2024-01-08 ENCOUNTER — Ambulatory Visit (HOSPITAL_COMMUNITY)
Admission: RE | Admit: 2024-01-08 | Discharge: 2024-01-08 | Disposition: A | Discharge status: Home / Self Care | Source: Ambulatory Visit

## 2024-01-08 DIAGNOSIS — Z1231 Encounter for screening mammogram for malignant neoplasm of breast: Secondary | ICD-10-CM | POA: Diagnosis not present

## 2024-01-12 ENCOUNTER — Ambulatory Visit: Payer: Self-pay

## 2024-01-19 ENCOUNTER — Ambulatory Visit: Payer: Self-pay

## 2024-01-29 ENCOUNTER — Ambulatory Visit: Admitting: Nutrition

## 2024-02-03 ENCOUNTER — Encounter: Attending: Internal Medicine | Admitting: Nutrition

## 2024-02-03 VITALS — Ht 67.0 in | Wt 284.0 lb

## 2024-02-03 DIAGNOSIS — I10 Essential (primary) hypertension: Secondary | ICD-10-CM | POA: Diagnosis not present

## 2024-02-03 DIAGNOSIS — E1142 Type 2 diabetes mellitus with diabetic polyneuropathy: Secondary | ICD-10-CM | POA: Insufficient documentation

## 2024-02-03 NOTE — Progress Notes (Signed)
 Medical Nutrition Therapy  Appointment Start time:  1030 Appointment End time:  1100 Primary concerns today: Dm Type 2, Hyperlipidemia, HTN, Morbid obesity  Referral diagnosis:E 11.69, E78, I10, E66.01 Preferred learning style: NO Preference  Learning readiness: Ready    NUTRITION ASSESSMENT Follow up Had been having some diarrhea and had to stop Metformin . Is on Ozempic  1.0. Drinking more water  unsweet tea. Still drinking 2 sodas per day [facility] and walkings 1-1/2 miles A1C was 7.7% Lost 2 lbs.  Clinical Wt Readings from Last 3 Encounters:  02/03/24 284 lb (128.8 kg)  12/18/23 286 lb (129.7 kg)  10/16/23 287 lb 9.6 oz (130.5 kg)   Ht Readings from Last 3 Encounters:  02/03/24 5' 7 (1.702 m)  12/18/23 5' 7 (1.702 m)  10/16/23 5' 7 (1.702 m)   Body mass index is 44.48 kg/m. @BMIFA @ Facility age limit for growth %iles is 20 years. Facility age limit for growth %iles is 20 years.  Medical Hx:  Past Medical History:  Diagnosis Date   Depression    Diabetes mellitus without complication (HCC)    Hypercholesterolemia    Melanoma (HCC)    Obesity    Obstructive sleep apnea on CPAP     Medications:  Current Outpatient Medications on File Prior to Visit  Medication Sig Dispense Refill   atorvastatin  (LIPITOR) 80 MG tablet Take 1 tablet (80 mg total) by mouth in the morning. 90 tablet 3   cholecalciferol (VITAMIN D ) 25 MCG (1000 UNIT) tablet Take 1,000 Units by mouth in the morning.     citalopram  (CELEXA ) 40 MG tablet Take 1 tablet (40 mg total) by mouth in the morning. 90 tablet 3   docusate sodium (COLACE) 100 MG capsule Take 200 mg by mouth every evening.     ezetimibe  (ZETIA ) 10 MG tablet Take 1 tablet (10 mg total) by mouth daily. 90 tablet 3   glimepiride  (AMARYL ) 4 MG tablet Take 1 tablet (4 mg total) by mouth daily with breakfast. 90 tablet 1   hydrochlorothiazide  (HYDRODIURIL ) 12.5 MG tablet Take 1 tablet (12.5 mg total) by mouth every morning.  90 tablet 1   lisinopril  (ZESTRIL ) 40 MG tablet Take 1 tablet (40 mg total) by mouth daily. 90 tablet 3   norethindrone  (AYGESTIN ) 5 MG tablet Take 1 tablet (5 mg total) by mouth daily. 90 tablet 3   pioglitazone  (ACTOS ) 30 MG tablet Take 1 tablet (30 mg total) by mouth daily. 90 tablet 1   Semaglutide , 1 MG/DOSE, 4 MG/3ML SOPN Inject 1 mg as directed once a week. 3 mL 2   vitamin B-12 (CYANOCOBALAMIN) 1000 MCG tablet Take 1,000 mcg by mouth in the morning.     No current facility-administered medications on file prior to visit.    Labs:  Lab Results  Component Value Date   HGBA1C 7.7 (H) 12/18/2023      Latest Ref Rng & Units 06/17/2023    9:16 AM 12/07/2022    9:49 AM 01/23/2020    2:12 PM  CMP  Glucose 70 - 99 mg/dL 883  844  97   BUN 8 - 27 mg/dL 16  12  17    Creatinine 0.57 - 1.00 mg/dL 9.15  9.21  9.20   Sodium 134 - 144 mmol/L 142  142  140   Potassium 3.5 - 5.2 mmol/L 4.4  4.0  3.5   Chloride 96 - 106 mmol/L 107  105  109   CO2 20 - 29 mmol/L 19  22  21   Calcium  8.7 - 10.3 mg/dL 8.9  9.3  9.0   Total Protein 6.0 - 8.5 g/dL  6.3    Total Bilirubin 0.0 - 1.2 mg/dL  0.3    Alkaline Phos 44 - 121 IU/L  72    AST 0 - 40 IU/L  10    ALT 0 - 32 IU/L  12     Lipid Panel     Component Value Date/Time   CHOL 130 12/18/2023 0849   TRIG 99 12/18/2023 0849   HDL 38 (L) 12/18/2023 0849   CHOLHDL 3.4 12/18/2023 0849   LDLCALC 73 12/18/2023 0849   LABVLDL 19 12/18/2023 0849    Notable Signs/Symptoms: fatigue, craves sweets, increased urination  Lifestyle & Dietary Hx Lives by herself  Estimated daily fluid intake: 60 oz- Diet Mt Dew-3 per day Supplements:  Sleep: poor  Stress / self-care: some Current average weekly physical activity: ADL  24-Hr Dietary Recall Nabs and fruit.  Doesn't drink water   Estimated Energy Needs Calories: 1200 Carbohydrate: 135g Protein: 90g Fat: 33g   NUTRITION DIAGNOSIS  NB-1.1 Food and nutrition-related knowledge deficit As  related to high calorie high carb processed food diet.  As evidenced by DM Type 2 A1C 7.4%, Morbid obesity, HTN and Hyperlipidemai.   NUTRITION INTERVENTION  Nutrition education (E-1) on the following topics:  Nutrition and Diabetes education provided on My Plate, CHO counting, meal planning, portion sizes, timing of meals, avoiding snacks between meals unless having a low blood sugar, target ranges for A1C and blood sugars, signs/symptoms and treatment of hyper/hypoglycemia, monitoring blood sugars, taking medications as prescribed, benefits of exercising 30 minutes per day and prevention of complications of DM.  Lifestyle Medicine  - Whole Food, Plant Predominant Nutrition is highly recommended: Eat Plenty of vegetables, Mushrooms, fruits, Legumes, Whole Grains, Nuts, seeds in lieu of processed meats, processed snacks/pastries red meat, poultry, eggs.    -It is better to avoid simple carbohydrates including: Cakes, Sweet Desserts, Ice Cream, Soda (diet and regular), Sweet Tea, Candies, Chips, Cookies, Store Bought Juices, Alcohol in Excess of  1-2 drinks a day, Lemonade,  Artificial Sweeteners, Doughnuts, Coffee Creamers, Sugar-free Products, etc, etc.  This is not a complete list.....  Exercise: If you are able: 30 -60 minutes a day ,4 days a week, or 150 minutes a week.  The longer the better.  Combine stretch, strength, and aerobic activities.  If you were told in the past that you have high risk for cardiovascular diseases, you may seek evaluation by your heart doctor prior to initiating moderate to intense exercise programs.   Handouts Provided Include  Know your numbers Lifestyle Medicine handouts  Learning Style & Readiness for Change Teaching method utilized: Visual & Auditory  Demonstrated degree of understanding via: Teach Back  Barriers to learning/adherence to lifestyle change: motivation to make lifestyle changes  Goals Established by Pt Keep eating 3 meals per day Cut out  sodas Focus on more fruits, vegetables and whole grains. Keep drinking water  Get in 150 minutes of exercise per week. Get A1C down to 7%.  MONITORING & EVALUATION Dietary intake, weekly physical activity, and weight and BS in 3 month.  Next Steps  Patient is to work on planning meals and cooking at home.SABRA

## 2024-02-05 ENCOUNTER — Encounter: Payer: Self-pay | Admitting: Nutrition

## 2024-02-05 DIAGNOSIS — D485 Neoplasm of uncertain behavior of skin: Secondary | ICD-10-CM | POA: Diagnosis not present

## 2024-02-05 DIAGNOSIS — Z08 Encounter for follow-up examination after completed treatment for malignant neoplasm: Secondary | ICD-10-CM | POA: Diagnosis not present

## 2024-02-05 DIAGNOSIS — Z85820 Personal history of malignant melanoma of skin: Secondary | ICD-10-CM | POA: Diagnosis not present

## 2024-02-05 DIAGNOSIS — D225 Melanocytic nevi of trunk: Secondary | ICD-10-CM | POA: Diagnosis not present

## 2024-02-05 DIAGNOSIS — Z1283 Encounter for screening for malignant neoplasm of skin: Secondary | ICD-10-CM | POA: Diagnosis not present

## 2024-02-05 NOTE — Patient Instructions (Addendum)
 Keep eating 3 meals per day Focus on more fruits, vegetables and whole grains. Keep drinking water Cut out sodas Get in 150 minutes of exercise per week.

## 2024-02-23 ENCOUNTER — Other Ambulatory Visit: Payer: Self-pay | Admitting: Internal Medicine

## 2024-02-23 DIAGNOSIS — I10 Essential (primary) hypertension: Secondary | ICD-10-CM

## 2024-02-28 ENCOUNTER — Other Ambulatory Visit: Payer: Self-pay | Admitting: Internal Medicine

## 2024-02-28 DIAGNOSIS — E1169 Type 2 diabetes mellitus with other specified complication: Secondary | ICD-10-CM

## 2024-03-16 ENCOUNTER — Other Ambulatory Visit: Payer: Self-pay

## 2024-03-16 DIAGNOSIS — E1142 Type 2 diabetes mellitus with diabetic polyneuropathy: Secondary | ICD-10-CM

## 2024-03-18 ENCOUNTER — Ambulatory Visit

## 2024-03-18 VITALS — BP 116/74 | HR 76 | Ht 67.0 in | Wt 283.0 lb

## 2024-03-18 DIAGNOSIS — E1142 Type 2 diabetes mellitus with diabetic polyneuropathy: Secondary | ICD-10-CM

## 2024-03-18 DIAGNOSIS — I10 Essential (primary) hypertension: Secondary | ICD-10-CM | POA: Diagnosis not present

## 2024-03-18 DIAGNOSIS — Z7984 Long term (current) use of oral hypoglycemic drugs: Secondary | ICD-10-CM | POA: Diagnosis not present

## 2024-03-18 MED ORDER — GLIMEPIRIDE 4 MG PO TABS
4.0000 mg | ORAL_TABLET | Freq: Every day | ORAL | 3 refills | Status: AC
Start: 2024-03-18 — End: ?

## 2024-03-18 MED ORDER — SEMAGLUTIDE (2 MG/DOSE) 8 MG/3ML ~~LOC~~ SOPN
2.0000 mg | PEN_INJECTOR | SUBCUTANEOUS | 5 refills | Status: AC
Start: 2024-03-18 — End: ?

## 2024-03-18 NOTE — Progress Notes (Signed)
 Established Patient Office Visit  Subjective   Patient ID: [name], female    DOB: March 06, 1956  Age: 68 y.o. MRN: [id number]  Chief Complaint  Patient presents with   Diabetes    Follow up    HPI Discussed the use of AI scribe software for clinical note transcription with the patient, who gave verbal consent to proceed.  History of Present Illness   [name] is a 68 year old female with type 2 diabetes who presents for a follow-up visit.  Glycemic control - Type 2 diabetes mellitus with most recent hemoglobin A1c of 7.7%. - Currently taking glimepiride , requires refill. - Administers Ozempic  at home, currently at 1 mg dose, due to increase to 2 mg. - No significant changes in glycemic control with current regimen.  Gastrointestinal function - Uses stool softeners. - Increasing water  intake.  Physical activity and functional status - Participates in exercise classes at the senior center on Mondays, Tuesdays, and Thursdays. - Walks 1.5 miles on Mondays. - Walks 1.5 miles and participates in a one-hour class with hand weights, stretch bands, and balls on Tuesdays and Thursdays. - Finds exercise beneficial and enjoys social interaction.  Psychosocial adjustment - Widow coping with the loss of her husband. - Enjoys social interaction during exercise classes, which aids in coping.  Neuropathy and foot health - No problems with her feet.      Patient Active Problem List   Diagnosis Date Noted   Diarrhea 09/16/2023   Dry cough 06/17/2023   Need for pneumococcal 20-valent conjugate vaccination 06/17/2023   Morbid obesity (HCC) 03/18/2023   Routine Papanicolaou smear 01/01/2023   PMB (postmenopausal bleeding) 01/01/2023   Type 2 diabetes mellitus with diabetic polyneuropathy (HCC) 12/07/2022   Essential hypertension 12/07/2022   History of melanoma 12/07/2022   Hyperlipidemia associated with type 2 diabetes mellitus (HCC) 12/07/2022   OSA on CPAP 12/07/2022    MDD (major depressive disorder) 12/07/2022   Spotting 12/07/2022   History of colonic polyps    RECTAL BLEEDING 02/07/2010   ANAL OR RECTAL PAIN 02/07/2010   Hx of adenomatous colonic polyps 02/07/2010    ROS    Objective:     BP 116/74   Pulse 76   Ht 5' 7 (1.702 m)   Wt 283 lb (128.4 kg)   SpO2 94%   BMI 44.32 kg/m  BP Readings from Last 3 Encounters:  03/18/24 116/74  12/18/23 116/74  09/16/23 133/79   Wt Readings from Last 3 Encounters:  03/18/24 283 lb (128.4 kg)  02/03/24 284 lb (128.8 kg)  12/18/23 286 lb (129.7 kg)     Physical Exam Vitals and nursing note reviewed.  Constitutional:      Appearance: Normal appearance. She is obese.  HENT:     Head: Normocephalic.     Right Ear: Tympanic membrane, ear canal and external ear normal.     Left Ear: Tympanic membrane, ear canal and external ear normal.     Nose: Nose normal.     Mouth/Throat:     Mouth: Mucous membranes are moist.     Pharynx: Oropharynx is clear.  Cardiovascular:     Rate and Rhythm: Normal rate and regular rhythm.  Pulmonary:     Effort: Pulmonary effort is normal.     Breath sounds: Normal breath sounds.  Musculoskeletal:     Cervical back: Normal range of motion and neck supple.  Skin:    General: Skin is warm and dry.  Neurological:  Mental Status: She is alert and oriented to person, place, and time.  Psychiatric:        Mood and Affect: Mood normal.        Thought Content: Thought content normal.        The 10-year ASCVD risk score (Arnett DK, et al., 2019) is: 16.4%    Assessment & Plan:   Problem List Items Addressed This Visit       Cardiovascular and Mediastinum   Essential hypertension - Primary   Well-controlled with good blood pressure readings. No medication changes made today.  No refills needed.        Relevant Orders   Basic Metabolic Panel (BMET) (Completed)     Endocrine   Type 2 diabetes mellitus with diabetic polyneuropathy (HCC)   Type  2 diabetes mellitus with diabetic polyneuropathy Managed with glimepiride  and Ozempic . A1c previously 7.7%. Weight loss noted with exercise. - Recheck A1c today. - Refill glimepiride  prescription. - Prescribed an ACE inhibitor. - Increase Ozempic  dose to 2 mg. - Advise to drink plenty of water  for constipation.      Relevant Medications   glimepiride  (AMARYL ) 4 MG tablet   Semaglutide , 2 MG/DOSE, 8 MG/3ML SOPN   Other Relevant Orders   Basic Metabolic Panel (BMET) (Completed)   HgB A1c (Completed)     Other   Morbid obesity (HCC)   BMI 44.32.  She has lost some weight, although not significant amount.   - Ozempic  increased to 2 mg.  Discussed ned for healthy diet and lifestyle to help achieve additional weight loss.         Relevant Medications   glimepiride  (AMARYL ) 4 MG tablet   Semaglutide , 2 MG/DOSE, 8 MG/3ML SOPN      No follow-ups on file.    Amy Longs, FNP

## 2024-03-19 LAB — BASIC METABOLIC PANEL WITH GFR
BUN/Creatinine Ratio: 23 (ref 12–28)
BUN: 23 mg/dL (ref 8–27)
CO2: 17 mmol/L — ABNORMAL LOW (ref 20–29)
Calcium: 8.8 mg/dL (ref 8.7–10.3)
Chloride: 106 mmol/L (ref 96–106)
Creatinine, Ser: 1.01 mg/dL — ABNORMAL HIGH (ref 0.57–1.00)
Glucose: 170 mg/dL — ABNORMAL HIGH (ref 70–99)
Potassium: 4.5 mmol/L (ref 3.5–5.2)
Sodium: 138 mmol/L (ref 134–144)
eGFR: 60 mL/min/1.73 (ref >59)

## 2024-03-19 LAB — HEMOGLOBIN A1C
Est. average glucose Bld gHb Est-mCnc: 189 mg/dL
Hgb A1c MFr Bld: 8.2 % — ABNORMAL HIGH (ref 4.8–5.6)

## 2024-03-22 NOTE — Assessment & Plan Note (Signed)
 Well-controlled with good blood pressure readings. No medication changes made today.  No refills needed.

## 2024-03-22 NOTE — Assessment & Plan Note (Addendum)
 Type 2 diabetes mellitus with diabetic polyneuropathy Managed with glimepiride  and Ozempic . A1c previously 7.7%. Weight loss noted with exercise. - Recheck A1c today. - Refill glimepiride  prescription. - Prescribed an ACE inhibitor. - Increase Ozempic  dose to 2 mg. - Advise to drink plenty of water  for constipation.

## 2024-03-22 NOTE — Assessment & Plan Note (Signed)
 BMI 44.32.  She has lost some weight, although not significant amount.   - Ozempic  increased to 2 mg.  Discussed ned for healthy diet and lifestyle to help achieve additional weight loss.

## 2024-04-20 ENCOUNTER — Encounter: Payer: Self-pay | Admitting: Obstetrics & Gynecology

## 2024-04-20 ENCOUNTER — Ambulatory Visit (INDEPENDENT_AMBULATORY_CARE_PROVIDER_SITE_OTHER): Admitting: Obstetrics & Gynecology

## 2024-04-20 VITALS — BP 132/78 | HR 90 | Ht 67.0 in | Wt 283.2 lb

## 2024-04-20 DIAGNOSIS — N950 Postmenopausal bleeding: Secondary | ICD-10-CM

## 2024-04-20 DIAGNOSIS — D259 Leiomyoma of uterus, unspecified: Secondary | ICD-10-CM

## 2024-04-20 DIAGNOSIS — Z6841 Body Mass Index (BMI) 40.0 and over, adult: Secondary | ICD-10-CM | POA: Diagnosis not present

## 2024-04-20 NOTE — Progress Notes (Signed)
 GYN VISIT Patient name: [name] MRN [id number]  Date of birth: 03-Oct-1955 Chief Complaint:   Annual Exam  History of Present Illness:   [name] is a 68 y.o. G1P0010 PM female being seen today for annual exam and follow up regarding:  H/o PMB likely due to uterine fibroid:  In review, this has been an ongoing issue for the past several years.  Due to the extent of bleeding- MRI was completed that suggested mass was only an endometrial fibroid.  She had been doing well with Aygestin  and only noted the occasional spotting.  Today she notes  occasional episodes of heavy bleeding in March that lasted for about 7 days with passage of clots.  Then again had a moderate bleeding August, but not quite as heavy or blood clots.  No LMP recorded. Patient is postmenopausal.    Review of Systems:   Pertinent items are noted in HPI Denies fever/chills, dizziness, headaches, visual disturbances, fatigue, shortness of breath, chest pain, abdominal pain, vomiting Pertinent History Reviewed:   Past Surgical History:  Procedure Laterality Date   ANAL FISSURE REPAIR  06/25/2010   Dr. Mavis   COLONOSCOPY  01/29/2003   NUR: Five small polyps that were ablated/Tiny diverticula at sigmoid colon   COLONOSCOPY  03/03/2010   MFM:jwjo canal papilla tender anal canal otherwise normal   COLONOSCOPY N/A 06/21/2014   Procedure: COLONOSCOPY;  Surgeon: Lamar CHRISTELLA Hollingshead, MD;  Location: [facility];  Service: Endoscopy;  Laterality: N/A;  745AM   COLONOSCOPY N/A 10/19/2020   Procedure: COLONOSCOPY;  Surgeon: Hollingshead Lamar CHRISTELLA, MD;  Location: [facility];  Service: Endoscopy;  Laterality: N/A;  ASA I / II / AM procedure   INCISE AND DRAIN ABCESS     melonoma removed     POLYPECTOMY  10/19/2020   Procedure: POLYPECTOMY;  Surgeon: Hollingshead Lamar CHRISTELLA, MD;  Location: [facility];  Service: Endoscopy;;    Past Medical History:  Diagnosis Date   Depression    Diabetes mellitus without complication  (HCC)    Hypercholesterolemia    Melanoma (HCC)    Obesity    Obstructive sleep apnea on CPAP    Reviewed problem list, medications and allergies. Physical Assessment:   Vitals:   04/20/24 1457  BP: 132/78  Pulse: 90  Weight: 283 lb 3.2 oz (128.5 kg)  Height: 5' 7 (1.702 m)  Body mass index is 44.36 kg/m.       Physical Examination:   General appearance: alert, well appearing, and in no distress   CV: RRR  Lungs: CTAB  Neck: no thyroid enlargement  Psych: mood appropriate, normal affect  Skin: warm & dry   Abdomen: obese, soft, non-tender, no rebound or guarding  Pelvic: VULVA: normal appearing vulva with no masses, tenderness or lesions, VAGINA: normal appearing vagina with normal color and discharge, no lesions, CERVIX: normal appearing cervix without discharge or lesions, no visible bleeding seen.  UTERUS: uterus is normal size, shape, consistency and nontender, ADNEXA: normal adnexa in size, nontender and no masses  Extremities: minimal edema, no calf tenderness bilaterally  Chaperone: Caroline Prestion    Assessment & Plan:  1) PMB -for now continue with Aygestin  -plan for US /SHG at next available, further management pending results -pt would prefer to avoid surgical intervention -maybe consider HSC/myomectomy since she would prefer to avoid hysterectomy  -mammogram up to date   Orders Placed This Encounter  Procedures   US  Sonohysterogram    Return for US CHUCKY  next avilable.   Asher Babilonia, DO Attending Obstetrician & Gynecologist, [facility] for [employer], [facility]

## 2024-04-21 ENCOUNTER — Telehealth: Payer: Self-pay

## 2024-04-21 ENCOUNTER — Other Ambulatory Visit: Payer: Self-pay | Admitting: Obstetrics & Gynecology

## 2024-04-21 DIAGNOSIS — D259 Leiomyoma of uterus, unspecified: Secondary | ICD-10-CM

## 2024-04-21 DIAGNOSIS — N950 Postmenopausal bleeding: Secondary | ICD-10-CM

## 2024-04-21 MED ORDER — OXYCODONE HCL 5 MG PO TABS: ORAL_TABLET | ORAL | 0 refills | Status: AC

## 2024-04-21 NOTE — Telephone Encounter (Signed)
 RN attempted to call patient about pre-procedure medication. VM full. MyChart message sent as well.

## 2024-04-22 ENCOUNTER — Ambulatory Visit (INDEPENDENT_AMBULATORY_CARE_PROVIDER_SITE_OTHER)

## 2024-04-22 ENCOUNTER — Ambulatory Visit: Admitting: Obstetrics & Gynecology

## 2024-04-22 DIAGNOSIS — D259 Leiomyoma of uterus, unspecified: Secondary | ICD-10-CM | POA: Diagnosis not present

## 2024-04-22 DIAGNOSIS — N950 Postmenopausal bleeding: Secondary | ICD-10-CM

## 2024-04-22 NOTE — Progress Notes (Signed)
 US  TV SONOHYSTEROGRAM:heterogeneous anteverted uterus,heterogeneous echogenic vascular solid endometrial mass 3.9 x 3.4 x 3.2 cm,normal ovaries,no free fluid,no pain during the ultrasound.  Sonohysterogram was preformed by Dr. Ozan with ultrasound guidance and surveillance throughout the procedure.  Saline was installed into the endometrial cavity and outlined the endometrial mass.   Chaperone Whitney

## 2024-05-01 ENCOUNTER — Other Ambulatory Visit: Payer: Self-pay

## 2024-05-01 ENCOUNTER — Telehealth: Payer: Self-pay

## 2024-05-01 DIAGNOSIS — E1169 Type 2 diabetes mellitus with other specified complication: Secondary | ICD-10-CM

## 2024-05-01 NOTE — Telephone Encounter (Signed)
 Copied from CRM #[id number]. Topic: Referral - Question >> May 01, 2024  8:21 AM Fonda T wrote: Reason for CRM: Received call from Precious with [facility], calling requesting a change in diagnosis code for referral of, [id number], and [phone].  Can be reached at [phone], if need to discuss further.

## 2024-05-04 ENCOUNTER — Encounter: Admitting: Nutrition

## 2024-05-04 VITALS — Ht 67.0 in | Wt 285.0 lb

## 2024-05-04 DIAGNOSIS — E1142 Type 2 diabetes mellitus with diabetic polyneuropathy: Secondary | ICD-10-CM | POA: Diagnosis not present

## 2024-05-04 NOTE — Progress Notes (Signed)
 Medical Nutrition Therapy  Appointment Start time:  1030 Appointment End time:  1100 Primary concerns today: Dm Type 2, Hyperlipidemia, HTN, Morbid obesity  Referral diagnosis:E 11.69, E78, I10, E66.01 Preferred learning style: NO Preference  Learning readiness: Ready    NUTRITION ASSESSMENT Follow up  Trying to cook more foods. Working on cutting out [employer]. Eating more brussel sprouts and broccoli Last A1C 9.2%. Stopped taking Metformin  and now is on Ozempic  2.0 as of last week. Walking at senior center. Goals set last visit Keep eating 3 meals per day- working in progress. Cut out sodas-has cut down Focus on more fruits, vegetables and whole grains.working on it. Keep drinking water - working on it. Get in 150 minutes of exercise per week.- Improved  Get A1C down to 7%.-no labs yet.  Clinic Wt Readings from Last 3 Encounters:  05/04/24 285 lb (129.3 kg)  04/20/24 283 lb 3.2 oz (128.5 kg)  03/18/24 283 lb (128.4 kg)   Ht Readings from Last 3 Encounters:  05/04/24 5' 7 (1.702 m)  04/20/24 5' 7 (1.702 m)  03/18/24 5' 7 (1.702 m)   Body mass index is 44.64 kg/m. @BMIFA @ Facility age limit for growth %iles is 20 years. Facility age limit for growth %iles is 20 years.  Medical Hx:  Past Medical History:  Diagnosis Date   Depression    Diabetes mellitus without complication (HCC)    Hypercholesterolemia    Melanoma (HCC)    Obesity    Obstructive sleep apnea on CPAP     Medications:  Current Outpatient Medications on File Prior to Visit  Medication Sig Dispense Refill   oxyCODONE (OXY IR/ROXICODONE) 5 MG immediate release tablet Take one tablet once before appointment 2 tablet 0   atorvastatin  (LIPITOR) 80 MG tablet Take 1 tablet (80 mg total) by mouth in the morning. 90 tablet 3   cholecalciferol (VITAMIN D ) 25 MCG (1000 UNIT) tablet Take 1,000 Units by mouth in the morning.     citalopram  (CELEXA ) 40 MG tablet Take 1 tablet (40 mg total) by mouth in the  morning. 90 tablet 3   docusate sodium (COLACE) 100 MG capsule Take 200 mg by mouth every evening.     ezetimibe  (ZETIA ) 10 MG tablet TAKE ONE TABLET BY MOUTH EVERY DAY 90 tablet 3   glimepiride  (AMARYL ) 4 MG tablet Take 1 tablet (4 mg total) by mouth daily with breakfast. 90 tablet 3   hydrochlorothiazide  (HYDRODIURIL ) 12.5 MG tablet Take 1 tablet (12.5 mg total) by mouth every morning. 90 tablet 1   lisinopril  (ZESTRIL ) 40 MG tablet TAKE ONE TABLET BY MOUTH EVERY DAY 90 tablet 3   norethindrone  (AYGESTIN ) 5 MG tablet Take 1 tablet (5 mg total) by mouth daily. 90 tablet 3   pioglitazone  (ACTOS ) 30 MG tablet Take 1 tablet (30 mg total) by mouth daily. 90 tablet 1   Semaglutide , 2 MG/DOSE, 8 MG/3ML SOPN Inject 2 mg as directed once a week. 3 mL 5   vitamin B-12 (CYANOCOBALAMIN) 1000 MCG tablet Take 1,000 mcg by mouth in the morning.     No current facility-administered medications on file prior to visit.    Labs:  Lab Results  Component Value Date   HGBA1C 8.2 (H) 03/18/2024      Latest Ref Rng & Units 03/18/2024    8:46 AM 06/17/2023    9:16 AM 12/07/2022    9:49 AM  CMP  Glucose 70 - 99 mg/dL 829  883  844   BUN 8 -  27 mg/dL 23  16  12    Creatinine 0.57 - 1.00 mg/dL 8.98  9.15  9.21   Sodium 134 - 144 mmol/L 138  142  142   Potassium 3.5 - 5.2 mmol/L 4.5  4.4  4.0   Chloride 96 - 106 mmol/L 106  107  105   CO2 20 - 29 mmol/L 17  19  22    Calcium  8.7 - 10.3 mg/dL 8.8  8.9  9.3   Total Protein 6.0 - 8.5 g/dL   6.3   Total Bilirubin 0.0 - 1.2 mg/dL   0.3   Alkaline Phos 44 - 121 IU/L   72   AST 0 - 40 IU/L   10   ALT 0 - 32 IU/L   12    Lipid Panel     Component Value Date/Time   CHOL 130 12/18/2023 0849   TRIG 99 12/18/2023 0849   HDL 38 (L) 12/18/2023 0849   CHOLHDL 3.4 12/18/2023 0849   LDLCALC 73 12/18/2023 0849   LABVLDL 19 12/18/2023 0849    Notable Signs/Symptoms: fatigue, craves sweets, increased urination  Lifestyle & Dietary Hx Lives by herself  Estimated  daily fluid intake: 60 oz- Diet Mt Dew-3 per day Supplements:  Sleep: poor  Stress / self-care: some Current average weekly physical activity: ADL  24-Hr Dietary Recall Nabs and fruit.  Doesn't drink water   Estimated Energy Needs Calories: 1200 Carbohydrate: 135g Protein: 90g Fat: 33g   NUTRITION DIAGNOSIS  NB-1.1 Food and nutrition-related knowledge deficit As related to high calorie high carb processed food diet.  As evidenced by DM Type 2 A1C 7.4%, Morbid obesity, HTN and Hyperlipidemai.   NUTRITION INTERVENTION  Nutrition education (E-1) on the following topics:  Nutrition and Diabetes education provided on My Plate, CHO counting, meal planning, portion sizes, timing of meals, avoiding snacks between meals unless having a low blood sugar, target ranges for A1C and blood sugars, signs/symptoms and treatment of hyper/hypoglycemia, monitoring blood sugars, taking medications as prescribed, benefits of exercising 30 minutes per day and prevention of complications of DM.  Lifestyle Medicine  - Whole Food, Plant Predominant Nutrition is highly recommended: Eat Plenty of vegetables, Mushrooms, fruits, Legumes, Whole Grains, Nuts, seeds in lieu of processed meats, processed snacks/pastries red meat, poultry, eggs.    -It is better to avoid simple carbohydrates including: Cakes, Sweet Desserts, Ice Cream, Soda (diet and regular), Sweet Tea, Candies, Chips, Cookies, Store Bought Juices, Alcohol in Excess of  1-2 drinks a day, Lemonade,  Artificial Sweeteners, Doughnuts, Coffee Creamers, Sugar-free Products, etc, etc.  This is not a complete list.....  Exercise: If you are able: 30 -60 minutes a day ,4 days a week, or 150 minutes a week.  The longer the better.  Combine stretch, strength, and aerobic activities.  If you were told in the past that you have high risk for cardiovascular diseases, you may seek evaluation by your heart doctor prior to initiating moderate to intense exercise  programs.   Handouts Provided Include  Know your numbers Lifestyle Medicine handouts  Learning Style & Readiness for Change Teaching method utilized: Visual & Auditory  Demonstrated degree of understanding via: Teach Back  Barriers to learning/adherence to lifestyle change: motivation to make lifestyle changes  Goals Established by Pt Lose 5 lbs  by next visit Cut down on eating out.  MONITORING & EVALUATION Dietary intake, weekly physical activity, and weight and BS in 3 month.  Next Steps  Patient is to work on planning  meals and cooking at home.SABRA

## 2024-05-04 NOTE — Patient Instructions (Signed)
 Lose 5 lbs  by next visit Cut down on eating out.

## 2024-05-05 ENCOUNTER — Other Ambulatory Visit: Payer: Self-pay

## 2024-05-05 DIAGNOSIS — E1169 Type 2 diabetes mellitus with other specified complication: Secondary | ICD-10-CM

## 2024-05-05 DIAGNOSIS — E66813 Obesity, class 3: Secondary | ICD-10-CM

## 2024-05-05 NOTE — Telephone Encounter (Signed)
 Please call to clarify.  Does she want me to change the referral to the diagnosis listed below?  I do not even see where I placed a referral for nutrition, but I will be happy to change it just let me know which diagnosis codes she needs.  Thanks

## 2024-05-05 NOTE — Telephone Encounter (Signed)
 Diabetes ( E11.69)  and obesity ( E66.9)

## 2024-05-05 NOTE — Telephone Encounter (Signed)
 Referral updated

## 2024-05-11 DIAGNOSIS — Z1283 Encounter for screening for malignant neoplasm of skin: Secondary | ICD-10-CM | POA: Diagnosis not present

## 2024-05-11 DIAGNOSIS — Z85820 Personal history of malignant melanoma of skin: Secondary | ICD-10-CM | POA: Diagnosis not present

## 2024-05-11 DIAGNOSIS — D2271 Melanocytic nevi of right lower limb, including hip: Secondary | ICD-10-CM | POA: Diagnosis not present

## 2024-05-11 DIAGNOSIS — D485 Neoplasm of uncertain behavior of skin: Secondary | ICD-10-CM | POA: Diagnosis not present

## 2024-05-11 DIAGNOSIS — Z08 Encounter for follow-up examination after completed treatment for malignant neoplasm: Secondary | ICD-10-CM | POA: Diagnosis not present

## 2024-05-11 DIAGNOSIS — D225 Melanocytic nevi of trunk: Secondary | ICD-10-CM | POA: Diagnosis not present

## 2024-05-12 ENCOUNTER — Encounter: Payer: Self-pay | Admitting: Nutrition

## 2024-05-19 DIAGNOSIS — D485 Neoplasm of uncertain behavior of skin: Secondary | ICD-10-CM | POA: Diagnosis not present

## 2024-06-15 ENCOUNTER — Ambulatory Visit (INDEPENDENT_AMBULATORY_CARE_PROVIDER_SITE_OTHER)

## 2024-06-15 ENCOUNTER — Ambulatory Visit (HOSPITAL_COMMUNITY)
Admission: RE | Admit: 2024-06-15 | Discharge: 2024-06-15 | Disposition: A | Discharge status: Home / Self Care | Source: Ambulatory Visit

## 2024-06-15 VITALS — BP 133/81 | HR 77 | Ht 67.0 in | Wt 283.0 lb

## 2024-06-15 DIAGNOSIS — M25551 Pain in right hip: Secondary | ICD-10-CM | POA: Diagnosis present

## 2024-06-15 DIAGNOSIS — E1142 Type 2 diabetes mellitus with diabetic polyneuropathy: Secondary | ICD-10-CM

## 2024-06-15 DIAGNOSIS — I10 Essential (primary) hypertension: Secondary | ICD-10-CM | POA: Diagnosis not present

## 2024-06-15 DIAGNOSIS — Z7985 Long-term (current) use of injectable non-insulin antidiabetic drugs: Secondary | ICD-10-CM | POA: Diagnosis not present

## 2024-06-15 DIAGNOSIS — G8929 Other chronic pain: Secondary | ICD-10-CM | POA: Diagnosis present

## 2024-06-15 NOTE — Assessment & Plan Note (Signed)
 Pain exacerbated by walking, differential includes arthritis or sciatic nerve involvement. - Ordered x-ray of right hip to evaluate for arthritis or structural issues.

## 2024-06-15 NOTE — Progress Notes (Signed)
 "  Established Patient Office Visit  Subjective   Patient ID: [name], female    DOB: March 01, 1956  Age: 68 y.o. MRN: [id number]  Chief Complaint  Patient presents with   Medical Management of Chronic Issues    Pt here for a 3 month follow up also pt states right hip pain score is about a 10 and uses icy hot and pt states its not getting better     HPI Discussed the use of AI scribe software for clinical note transcription with the patient, who gave verbal consent to proceed.  History of Present Illness    [name] is a 68 year old female who presents with right hip pain and for a three-month checkup.  Right hip pain - Significant right hip pain, progressively worsening over time - Pain exacerbated by walking, severe enough to prevent attending church - Using a grocery cart for support provides some relief - Engages in regular physical activity, walking 1.5 miles three [facility] a [facility] the senior center, which helps alleviate pain - Pain was particularly severe the previous day - No current use of medication for hip pain, as severity has only recently increased - No prior x-ray of the hip; MRI of the pelvis performed last year  Musculoskeletal and functional status - No significant back problems, occasional minor issues - Spends significant time in a recliner  Bleeding tendency - History of bleeding problems - Takes medication for bleeding tendency (specific medication not identified)  Diabetes mellitus - On highest dose of Ozempic  for diabetes management  Bowel function - Regular bowel movements with the aid of a stool softener - History of surgery for a tear related to straining  Ophthalmologic care - Scheduled for cataract surgery on January 16th     Patient Active Problem List   Diagnosis Date Noted   Chronic right hip pain 06/15/2024   Diarrhea 09/16/2023   Dry cough 06/17/2023   Need for pneumococcal 20-valent conjugate vaccination 06/17/2023    Morbid obesity (HCC) 03/18/2023   Routine Papanicolaou smear 01/01/2023   PMB (postmenopausal bleeding) 01/01/2023   Type 2 diabetes mellitus with diabetic polyneuropathy (HCC) 12/07/2022   Essential hypertension 12/07/2022   History of melanoma 12/07/2022   Hyperlipidemia associated with type 2 diabetes mellitus (HCC) 12/07/2022   OSA on CPAP 12/07/2022   MDD (major depressive disorder) 12/07/2022   Spotting 12/07/2022   History of colonic polyps    RECTAL BLEEDING 02/07/2010   ANAL OR RECTAL PAIN 02/07/2010   Hx of adenomatous colonic polyps 02/07/2010    ROS    Objective:     BP 133/81   Pulse 77   Ht 5' 7 (1.702 m)   Wt 283 lb (128.4 kg)   SpO2 97%   BMI 44.32 kg/m  BP Readings from Last 3 Encounters:  06/15/24 133/81  04/20/24 132/78  03/18/24 116/74   Wt Readings from Last 3 Encounters:  06/15/24 283 lb (128.4 kg)  05/04/24 285 lb (129.3 kg)  04/20/24 283 lb 3.2 oz (128.5 kg)     Physical Exam Vitals and nursing note reviewed.  Constitutional:      Appearance: Normal appearance. She is obese.  HENT:     Head: Normocephalic.     Right Ear: Tympanic membrane, ear canal and external ear normal.     Left Ear: Tympanic membrane, ear canal and external ear normal.     Nose: Nose normal.     Mouth/Throat:     Mouth:  Mucous membranes are moist.     Pharynx: Oropharynx is clear.  Cardiovascular:     Rate and Rhythm: Normal rate and regular rhythm.  Pulmonary:     Effort: Pulmonary effort is normal.     Breath sounds: Normal breath sounds.  Musculoskeletal:     Cervical back: Normal range of motion and neck supple.  Skin:    General: Skin is warm and dry.  Neurological:     Mental Status: She is alert and oriented to person, place, and time.  Psychiatric:        Mood and Affect: Mood normal.        Thought Content: Thought content normal.      No results found for any visits on 06/15/24.  Last CBC Lab Results  Component Value Date   WBC 5.5  12/07/2022   HGB 13.1 12/07/2022   HCT 40.6 12/07/2022   MCV 92 12/07/2022   MCH 29.6 12/07/2022   RDW 13.6 12/07/2022   PLT 233 12/07/2022   Last metabolic panel Lab Results  Component Value Date   GLUCOSE 170 (H) 03/18/2024   NA 138 03/18/2024   K 4.5 03/18/2024   CL 106 03/18/2024   CO2 17 (L) 03/18/2024   BUN 23 03/18/2024   CREATININE 1.01 (H) 03/18/2024   EGFR 60 03/18/2024   CALCIUM  8.8 03/18/2024   PROT 6.3 12/07/2022   ALBUMIN 4.1 12/07/2022   LABGLOB 2.2 12/07/2022   AGRATIO 1.9 12/07/2022   BILITOT 0.3 12/07/2022   ALKPHOS 72 12/07/2022   AST 10 12/07/2022   ALT 12 12/07/2022   ANIONGAP 10 01/23/2020   Last lipids Lab Results  Component Value Date   CHOL 130 12/18/2023   HDL 38 (L) 12/18/2023   LDLCALC 73 12/18/2023   TRIG 99 12/18/2023   CHOLHDL 3.4 12/18/2023   Last hemoglobin A1c Lab Results  Component Value Date   HGBA1C 8.2 (H) 03/18/2024   Last thyroid functions Lab Results  Component Value Date   TSH 2.110 12/07/2022   FREET4 1.14 12/07/2022   Last vitamin D  Lab Results  Component Value Date   VD25OH 31.7 12/07/2022   Last vitamin B12 and Folate Lab Results  Component Value Date   VITAMINB12 808 12/07/2022   FOLATE 12.3 12/07/2022     The 10-year ASCVD risk score (Arnett DK, et al., 2019) is: 21.1%    Assessment & Plan:   Problem List Items Addressed This Visit       Cardiovascular and Mediastinum   Essential hypertension   Well-controlled with good blood pressure readings. No medication changes made today.  No refills needed.        Relevant Orders   Basic Metabolic Panel (BMET)     Endocrine   Type 2 diabetes mellitus with diabetic polyneuropathy (HCC)   Diabetes managed with Ozempic . - Continue current management with Ozempic . - Manage constipation with stool softeners as needed.      Relevant Orders   HgB A1c   Basic Metabolic Panel (BMET)     Other   Chronic right hip pain - Primary   Pain exacerbated by  walking, differential includes arthritis or sciatic nerve involvement. - Ordered x-ray of right hip to evaluate for arthritis or structural issues.      Relevant Orders   DG HIP UNILAT W OR W/O PELVIS 2-3 VIEWS RIGHT   No follow-ups on file.    Leita Longs, FNP  "

## 2024-06-15 NOTE — Assessment & Plan Note (Signed)
 Diabetes managed with Ozempic . - Continue current management with Ozempic . - Manage constipation with stool softeners as needed.

## 2024-06-15 NOTE — Assessment & Plan Note (Signed)
 Well-controlled with good blood pressure readings. No medication changes made today.  No refills needed.

## 2024-06-16 LAB — BASIC METABOLIC PANEL WITH GFR
BUN/Creatinine Ratio: 15 (ref 12–28)
BUN: 16 mg/dL (ref 8–27)
CO2: 16 mmol/L — ABNORMAL LOW (ref 20–29)
Calcium: 9.2 mg/dL (ref 8.7–10.3)
Chloride: 107 mmol/L — ABNORMAL HIGH (ref 96–106)
Creatinine, Ser: 1.04 mg/dL — ABNORMAL HIGH (ref 0.57–1.00)
Glucose: 162 mg/dL — ABNORMAL HIGH (ref 70–99)
Potassium: 4.5 mmol/L (ref 3.5–5.2)
Sodium: 139 mmol/L (ref 134–144)
eGFR: 58 mL/min/1.73 — ABNORMAL LOW

## 2024-06-16 LAB — HEMOGLOBIN A1C
Est. average glucose Bld gHb Est-mCnc: 214 mg/dL
Hgb A1c MFr Bld: 9.1 % — ABNORMAL HIGH (ref 4.8–5.6)

## 2024-06-26 ENCOUNTER — Telehealth: Payer: Self-pay

## 2024-06-26 NOTE — Telephone Encounter (Signed)
 Labs have not been read yet, As soon as they have been I will give pt a call

## 2024-06-26 NOTE — Telephone Encounter (Signed)
 Patient would like a nurse to call her and go over her lab results [phone]

## 2024-06-30 DIAGNOSIS — I10 Essential (primary) hypertension: Secondary | ICD-10-CM

## 2024-07-06 NOTE — H&P (Signed)
 Surgical History & Physical  Patient Name: [name]  DOB: 10-14-1955  Surgery: Cataract extraction with intraocular lens implant phacoemulsification; Right Eye Surgeon: Lynwood Hermann MD Surgery Date: 07/10/2024 Pre-Op Date: 06/08/2024  HPI: A 75 Yr. old female patient 1. The patient complains of difficulty when reading fine print, books, newspaper, instructions etc., which began 6 months ago. Overall she states that she has a lot of trouble seeing anything, far or close. Both eyes are affected. The episode is gradual. The condition's severity decreased since last visit. This is negatively affecting the patient's quality of life and the patient is unable to function adequately in life with the current level of vision. HPI was performed by Lynwood Hermann .  Medical History:  Diabetes High Blood Pressure  Review of Systems Cardiovascular High Blood Pressure Endocrine diabetes All recorded systems are negative except as noted above.  Social Never Smoked  Medication Prednisolone-moxiflox-bromfen,  Ozempic , Hydrochlorothiazide , Gallifrey , Citalopram , Spikevax [phone] up)(PF), Fluzone High-Dose 2025-26 (PF), Oxycodone , Glimepiride , Pioglitazone , Atorvastatin   Sx/Procedures None  Drug Allergies  NKDA  History & Physical: Heent: cataracts NECK: supple without bruits LUNGS: lungs clear to auscultation CV: regular rate and rhythm Abdomen: soft and non-tender  Impression & Plan: Assessment: 1.  COMBINED FORMS AGE RELATED CATARACT; Both Eyes (H25.813) 2.  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  DERMATOCHALASIS, no surgery; Right Upper Lid, Left Upper Lid (H02.831, H02.834) 4.  BLEPHARITIS; Right Upper Lid, Right Lower Lid, Left Upper Lid, Left Lower Lid (H01.001, H01.002,H01.004,H01.005) 5.  Pinguecula; Both Eyes (H11.153) 6.  ASTIGMATISM, REGULAR; Both Eyes (H52.223)  Plan: 1.  Cataract accounts for the patient's decreased vision. This visual impairment is  not correctable with a tolerable change in glasses or contact lenses. Cataract surgery with an implantation of a new lens should significantly improve the visual and functional status of the patient. Recommend phacoemulsification with intraocular lens. Discussed all risks, benefits, alternatives, and potential complications. Discussed the procedures and recovery. The patient desires to have surgery. A-scan/Biometry ordered and will be performed for intraocular lens calculations. The surgery will be performed in order to improve vision for driving, reading, and for eye examinations. Recommend Dextenza  for post-operative pain and inflammation. Educational materials provided: Cataract. History of corneal refractive Surgery: None History of Previous Ocular Surgery (PPV, other): None History of ocular trauma: None Use of Eye Pressure Lowering Drops: None No current contact lens use. Vision Blue - dense cortical. Pupil Status: Dilates poorly - shugarcaine or Lidocaine +Omidira by protocol, Malyugin Ring Right Eye worse. OD first, then OS Standard Lens only.  2.  Findings, prognosis and treatment options reviewed. About 30-50% chance will resolve on own. If patient develops increasing symptomatic visual loss from traction or full-thickness macular hole will refer to Retina. Monitor.  3.  Asymptomatic, recommend observation for now. Findings, prognosis and treatment options reviewed.  4.  Blepharitis is present - recommend regular lid cleaning.  5.  Observe; Artificial tears as needed for irritation.  6.  Very mild.

## 2024-07-08 ENCOUNTER — Encounter (HOSPITAL_COMMUNITY): Payer: Self-pay

## 2024-07-08 ENCOUNTER — Other Ambulatory Visit: Payer: Self-pay

## 2024-07-08 ENCOUNTER — Ambulatory Visit: Payer: Self-pay

## 2024-07-08 ENCOUNTER — Encounter (HOSPITAL_COMMUNITY)
Admission: RE | Admit: 2024-07-08 | Discharge: 2024-07-08 | Disposition: A | Discharge status: Home / Self Care | Source: Ambulatory Visit | Attending: Ophthalmology | Admitting: Ophthalmology

## 2024-07-08 ENCOUNTER — Telehealth: Payer: Self-pay

## 2024-07-08 NOTE — Telephone Encounter (Signed)
 Copied from CRM #[id number]. Topic: Clinical - Lab/Test Results >> Jul 08, 2024 10:39 AM Wess RAMAN wrote: Reason for CRM: Patient would like to know imaging results  Callback #: [id number]

## 2024-07-10 ENCOUNTER — Ambulatory Visit (HOSPITAL_COMMUNITY): Admitting: Anesthesiology

## 2024-07-10 ENCOUNTER — Encounter (HOSPITAL_COMMUNITY): Admission: RE | Disposition: A | Payer: Self-pay | Source: Home / Self Care | Attending: Ophthalmology

## 2024-07-10 ENCOUNTER — Ambulatory Visit (HOSPITAL_COMMUNITY)
Admission: RE | Admit: 2024-07-10 | Discharge: 2024-07-10 | Disposition: A | Discharge status: Home / Self Care | Attending: Ophthalmology | Admitting: Ophthalmology

## 2024-07-10 ENCOUNTER — Encounter (HOSPITAL_COMMUNITY): Payer: Self-pay | Admitting: Ophthalmology

## 2024-07-10 DIAGNOSIS — Z6841 Body Mass Index (BMI) 40.0 and over, adult: Secondary | ICD-10-CM | POA: Insufficient documentation

## 2024-07-10 DIAGNOSIS — H5711 Ocular pain, right eye: Secondary | ICD-10-CM | POA: Diagnosis present

## 2024-07-10 DIAGNOSIS — G4733 Obstructive sleep apnea (adult) (pediatric): Secondary | ICD-10-CM | POA: Insufficient documentation

## 2024-07-10 DIAGNOSIS — H25811 Combined forms of age-related cataract, right eye: Secondary | ICD-10-CM | POA: Insufficient documentation

## 2024-07-10 DIAGNOSIS — Z87891 Personal history of nicotine dependence: Secondary | ICD-10-CM | POA: Insufficient documentation

## 2024-07-10 DIAGNOSIS — I10 Essential (primary) hypertension: Secondary | ICD-10-CM | POA: Insufficient documentation

## 2024-07-10 DIAGNOSIS — E1136 Type 2 diabetes mellitus with diabetic cataract: Secondary | ICD-10-CM | POA: Diagnosis not present

## 2024-07-10 HISTORY — PX: INSERTION, STENT, DRUG-ELUTING, LACRIMAL CANALICULUS: SHX7453

## 2024-07-10 HISTORY — PX: CATARACT EXTRACTION W/PHACO: SHX586

## 2024-07-10 LAB — GLUCOSE, CAPILLARY: Glucose-Capillary: 164 mg/dL — ABNORMAL HIGH (ref 70–99)

## 2024-07-10 MED ORDER — MIDAZOLAM HCL (PF) 2 MG/2ML IJ SOLN
INTRAMUSCULAR | Status: DC | PRN
  Administered 2024-07-10: 1 mg via INTRAVENOUS

## 2024-07-10 MED ORDER — BSS IO SOLN
INTRAOCULAR | Status: DC | PRN
  Administered 2024-07-10: 15 mL via INTRAOCULAR

## 2024-07-10 MED ORDER — SODIUM CHLORIDE 0.9% FLUSH
INTRAVENOUS | Status: DC | PRN
  Administered 2024-07-10: 3 mL via INTRAVENOUS

## 2024-07-10 MED ORDER — LIDOCAINE HCL 3.5 % OP GEL: 1.0000 | Freq: Once | OPHTHALMIC | Status: DC

## 2024-07-10 MED ORDER — DEXAMETHASONE 0.4 MG OP INST
VAGINAL_INSERT | OPHTHALMIC | Status: DC | PRN
  Administered 2024-07-10: .4 mg via OPHTHALMIC

## 2024-07-10 MED ORDER — STERILE WATER FOR IRRIGATION IR SOLN
Status: DC | PRN
  Administered 2024-07-10: 1

## 2024-07-10 MED ORDER — LACTATED RINGERS IV SOLN: INTRAVENOUS | Status: DC

## 2024-07-10 MED ORDER — MOXIFLOXACIN HCL 5 MG/ML IO SOLN
INTRAOCULAR | Status: DC | PRN
  Administered 2024-07-10: .2 mL via INTRACAMERAL

## 2024-07-10 MED ORDER — LIDOCAINE HCL (PF) 1 % IJ SOLN
INTRAMUSCULAR | Status: DC | PRN
  Administered 2024-07-10: 1 mL

## 2024-07-10 MED ORDER — SODIUM HYALURONATE 23MG/ML IO SOSY
PREFILLED_SYRINGE | INTRAOCULAR | Status: DC | PRN
  Administered 2024-07-10: .6 mL via INTRAOCULAR

## 2024-07-10 MED ORDER — MIDAZOLAM HCL 2 MG/2ML IJ SOLN
INTRAMUSCULAR | Status: AC
  Filled 2024-07-10: qty 2

## 2024-07-10 MED ORDER — TRYPAN BLUE 0.06 % IO SOSY
PREFILLED_SYRINGE | INTRAOCULAR | Status: AC
  Filled 2024-07-10: qty 0.5

## 2024-07-10 MED ORDER — PHENYLEPHRINE-KETOROLAC 1-0.3 % IO SOLN
INTRAOCULAR | Status: DC | PRN
  Administered 2024-07-10: 500 mL via OPHTHALMIC

## 2024-07-10 MED ORDER — TETRACAINE HCL 0.5 % OP SOLN
1.0000 [drp] | OPHTHALMIC | Status: AC | PRN
  Administered 2024-07-10 (×3): 1 [drp] via OPHTHALMIC

## 2024-07-10 MED ORDER — TROPICAMIDE 1 % OP SOLN
1.0000 [drp] | OPHTHALMIC | Status: AC | PRN
  Administered 2024-07-10 (×3): 1 [drp] via OPHTHALMIC

## 2024-07-10 MED ORDER — PHENYLEPHRINE HCL 2.5 % OP SOLN
1.0000 [drp] | OPHTHALMIC | Status: AC | PRN
  Administered 2024-07-10 (×3): 1 [drp] via OPHTHALMIC

## 2024-07-10 MED ORDER — DEXAMETHASONE 0.4 MG OP INST
VAGINAL_INSERT | OPHTHALMIC | Status: AC
  Filled 2024-07-10: qty 1

## 2024-07-10 MED ORDER — POVIDONE-IODINE 5 % OP SOLN
OPHTHALMIC | Status: DC | PRN
  Administered 2024-07-10: 1 via OPHTHALMIC

## 2024-07-10 MED ORDER — SODIUM HYALURONATE 10 MG/ML IO SOLUTION
PREFILLED_SYRINGE | INTRAOCULAR | Status: DC | PRN
  Administered 2024-07-10: .85 mL via INTRAOCULAR

## 2024-07-10 NOTE — Discharge Instructions (Addendum)
 Please discharge patient when stable, will follow up today with Dr. June Leap at the [facility] office immediately following discharge.  Leave shield in place until visit.  All paperwork with discharge instructions will be given at the office.  [facility] Address:  [address]  [place], [place] [address]

## 2024-07-10 NOTE — Anesthesia Preprocedure Evaluation (Signed)
"                                    Anesthesia Evaluation  Patient identified by MRN, date of birth, ID band Patient awake    Reviewed: Allergy & Precautions, H&P , NPO status , Patient's Chart, lab work & pertinent test results, reviewed documented beta blocker date and time   Airway Mallampati: II  TM Distance: >3 FB Neck ROM: full    Dental no notable dental hx.    Pulmonary sleep apnea , former smoker   Pulmonary exam normal breath sounds clear to auscultation       Cardiovascular Exercise Tolerance: Good hypertension,  Rhythm:regular Rate:Normal     Neuro/Psych  PSYCHIATRIC DISORDERS  Depression     Neuromuscular disease    GI/Hepatic negative GI ROS, Neg liver ROS,,,  Endo/Other  diabetes  Class 4 obesity  Renal/GU negative Renal ROS  negative genitourinary   Musculoskeletal   Abdominal   Peds  Hematology negative hematology ROS (+)   Anesthesia Other Findings   Reproductive/Obstetrics negative OB ROS                              Anesthesia Physical Anesthesia Plan  ASA: 3  Anesthesia Plan: MAC   Post-op Pain Management:    Induction:   PONV Risk Score and Plan:   Airway Management Planned:   Additional Equipment:   Intra-op Plan:   Post-operative Plan:   Informed Consent: I have reviewed the patients History and Physical, chart, labs and discussed the procedure including the risks, benefits and alternatives for the proposed anesthesia with the patient or authorized representative who has indicated his/her understanding and acceptance.     Dental Advisory Given  Plan Discussed with: CRNA  Anesthesia Plan Comments:         Anesthesia Quick Evaluation  "

## 2024-07-10 NOTE — Op Note (Signed)
 Date of procedure: 07/10/24  Pre-operative diagnosis:  Visually significant combined form age-related cataract, Right Eye (H25.811)  Post-operative diagnosis:   1. Visually significant combined form age-related cataract, Right Eye (H25.811) 2. Pain and inflammation following cataract surgery Right Eye (H57.11)  Procedure:  Removal of cataract via phacoemulsification and insertion of intra-ocular lens Johnson and Johnson DIB00 +22.0D into the capsular bag of the Right Eye 2. Placement of Dextenza  insert, Right Eye  Attending surgeon: Lynwood LABOR. Latorria Zeoli, MD, MA  Anesthesia: MAC, Topical Akten  Complications: None  Estimated Blood Loss: <58mL (minimal)  Specimens: None  Implants: As above  Indications:  Visually significant age-related cataract, Right Eye  Procedure:  The patient was seen and identified in the pre-operative area. The operative eye was identified and dilated.  The operative eye was marked.  Topical anesthesia was administered to the operative eye.     The patient was then to the operative suite and placed in the supine position.  A timeout was performed confirming the patient, procedure to be performed, and all other relevant information.   The patient's face was prepped and draped in the usual fashion for intra-ocular surgery.  A lid speculum was placed into the operative eye and the surgical microscope moved into place and focused.  A superotemporal paracentesis was created using a 20 gauge paracentesis blade. Omidria  was injected into the anterior chamber. Shugarcaine was injected into the anterior chamber.  Viscoelastic was injected into the anterior chamber.  A temporal clear-corneal main wound incision was created using a 2.52mm microkeratome.  A continuous curvilinear capsulorrhexis was initiated using an irrigating cystitome and completed using capsulorrhexis forceps.  Hydrodissection and hydrodeliniation were performed.  Viscoelastic was injected into the anterior  chamber.  A phacoemulsification handpiece and a chopper as a second instrument were used to remove the nucleus and epinucleus. The irrigation/aspiration handpiece was used to remove any remaining cortical material.   The capsular bag was reinflated with viscoelastic, checked, and found to be intact.  The intraocular lens was inserted into the capsular bag.  The irrigation/aspiration handpiece was used to remove any remaining viscoelastic.  The clear corneal wound and paracentesis wounds were then hydrated and checked with Weck-Cels to be watertight. 0.1mL of moxifloxacin  was injected into the anterior chamber.  The lid-speculum was removed. The lower punctum was dilated. A Dextenza  implant was placed in the lower canaliculus without complication.  The drape was removed.  The patient's face was cleaned with a wet and dry 4x4. A clear shield was taped over the eye. The patient was taken to the post-operative care unit in good condition, having tolerated the procedure well.  Post-Op Instructions: The patient will follow up at [facility] for a same day post-operative evaluation and will receive all other orders and instructions.

## 2024-07-10 NOTE — Anesthesia Postprocedure Evaluation (Signed)
"   Anesthesia Post Note  Patient: [name]  Procedure(s) Performed: PHACOEMULSIFICATION, CATARACT, WITH IOL INSERTION (Right: Eye) INSERTION, STENT, DRUG-ELUTING, LACRIMAL CANALICULUS (Right)  Patient location during evaluation: Phase II Anesthesia Type: MAC Level of consciousness: awake Pain management: pain level controlled Vital Signs Assessment: post-procedure vital signs reviewed and stable Respiratory status: spontaneous breathing and respiratory function stable Cardiovascular status: blood pressure returned to baseline and stable Postop Assessment: no headache and no apparent nausea or vomiting Anesthetic complications: no Comments: Late entry   No notable events documented.   Last Vitals:  Vitals:   07/10/24 0936 07/10/24 1029  BP: 139/82 134/75  Pulse: 80 79  Resp: 18 18  Temp: 37.1 C 36.9 C  SpO2:  99%    Last Pain:  Vitals:   07/10/24 1029  TempSrc: Oral  PainSc: 0-No pain                 [name] Gerlean Cid      "

## 2024-07-10 NOTE — Anesthesia Procedure Notes (Signed)
 Date/Time: 07/10/2024 10:07 AM  Performed by: Barbarann Verneita RAMAN, CRNAPre-anesthesia Checklist: Patient identified, Emergency Drugs available, Suction available, Timeout performed and Patient being monitored Patient Re-evaluated:Patient Re-evaluated prior to induction Oxygen Delivery Method: Nasal Cannula

## 2024-07-10 NOTE — Interval H&P Note (Signed)
 History and Physical Interval Note:  07/10/2024 10:03 AM  [name]  has presented today for surgery, with the diagnosis of combined forms age related cataract, right eye.  The various methods of treatment have been discussed with the patient and family. After consideration of risks, benefits and other options for treatment, the patient has consented to  Procedures with comments: PHACOEMULSIFICATION, CATARACT, WITH IOL INSERTION (Right) - CDE: INSERTION, STENT, DRUG-ELUTING, LACRIMAL CANALICULUS (Right) as a surgical intervention.  The patient's history has been reviewed, patient examined, no change in status, stable for surgery.  I have reviewed the patient's chart and labs.  Questions were answered to the patient's satisfaction.     HARRIE AGENT

## 2024-07-10 NOTE — Transfer of Care (Signed)
 Immediate Anesthesia Transfer of Care Note  Patient: [name]  Procedure(s) Performed: PHACOEMULSIFICATION, CATARACT, WITH IOL INSERTION (Right: Eye) INSERTION, STENT, DRUG-ELUTING, LACRIMAL CANALICULUS (Right)  Patient Location: Short Stay  Anesthesia Type:MAC  Level of Consciousness: awake and patient cooperative  Airway & Oxygen Therapy: Patient Spontanous Breathing  Post-op Assessment: Report given to RN and Post -op Vital signs reviewed and stable  Post vital signs: Reviewed and stable  Last Vitals:  Vitals Value Taken Time  BP 134/75 07/10/24 10:29  Temp 36.9 C 07/10/24 10:29  Pulse 79 07/10/24 10:29  Resp 18 07/10/24 10:29  SpO2 99 % 07/10/24 10:29    Last Pain:  Vitals:   07/10/24 1029  TempSrc: Oral  PainSc: 0-No pain      Patients Stated Pain Goal: 7 (07/10/24 0936)  Complications: No notable events documented.

## 2024-07-13 ENCOUNTER — Encounter (HOSPITAL_COMMUNITY): Payer: Self-pay | Admitting: Ophthalmology

## 2024-07-17 ENCOUNTER — Encounter (HOSPITAL_COMMUNITY): Payer: Self-pay

## 2024-07-21 ENCOUNTER — Encounter (HOSPITAL_COMMUNITY)
Admission: RE | Admit: 2024-07-21 | Discharge: 2024-07-21 | Disposition: A | Discharge status: Home / Self Care | Source: Ambulatory Visit | Attending: Ophthalmology | Admitting: Ophthalmology

## 2024-07-21 NOTE — H&P (Signed)
 Surgical History & Physical  Patient Name: [name]  DOB: [birth year]  Surgery: Cataract extraction with intraocular lens implant phacoemulsification; Left Eye Surgeon: Lynwood Hermann MD Surgery Date: 07/24/2024 Pre-Op Date: 07/16/2024  HPI: A 69 Yr. old female patient 1. The patient is returning after cataract surgery. The right eye is affected. Status post cataract surgery, which began 6 days ago: Since the last visit, the affected area feels improvement. The patient's vision is improved. The condition's severity is constant. Patient is following medication instructions.   2. The patient is returning for a cataract follow-up of the left eye. The patient's vision is blurry. The condition's severity is constant. Patient is not taking medications. This interfering with the patients daily tasks including driving at night and reading. This is negatively affecting the patient's quality of life and the patient is unable to function adequately in life with the current level of vision. HPI Completed by Dr. Lynwood Hermann  Medical History:  Diabetes High Blood Pressure  Review of Systems Cardiovascular High Blood Pressure Endocrine diabetes All recorded systems are negative except as noted above.  Social Never Smoked  Medication Prednisolone-moxiflox-bromfen,  Ozempic , Hydrochlorothiazide , Gallifrey , Citalopram , Spikevax [phone] up)(PF), Fluzone High-Dose 2025-26 (PF), Oxycodone , Glimepiride , Pioglitazone , Atorvastatin    Sx/Procedures Phaco c IOL OD-dex  Drug Allergies  NKDA  History & Physical: Heent: cataract NECK: supple without bruits LUNGS: lungs clear to auscultation CV: regular rate and rhythm Abdomen: soft and non-tender  Impression & Plan: Assessment: 1.  CATARACT EXTRACTION STATUS; Right Eye (Z98.41) 2.  COMBINED FORMS AGE RELATED CATARACT; Both Eyes (H25.813)  Plan: 1.  1 week after cataract surgery. Doing well with improved vision and normal eye pressure.  Call with any problems or concerns. Continue Pred-Mox-Brom Combo drop 2x/day for 1 more week.  2.  Cataract accounts for the patient's decreased vision. This visual impairment is not correctable with a tolerable change in glasses or contact lenses. Cataract surgery with an implantation of a new lens should significantly improve the visual and functional status of the patient. Recommend phacoemulsification with intraocular lens. Discussed all risks, benefits, alternatives, and potential complications. Discussed the procedures and recovery. The patient desires to have surgery. A-scan/Biometry ordered and will be performed for intraocular lens calculations. The surgery will be performed in order to improve vision for driving, reading, and for eye examinations. Recommend Dextenza  for post-operative pain and inflammation. Educational materials provided: Cataract. History of corneal refractive Surgery: None History of Previous Ocular Surgery (PPV, other): None History of ocular trauma: None Use of Eye Pressure Lowering Drops: None No current contact lens use. Vision Blue - dense cortical. Pupil Status: Dilates poorly - shugarcaine or Lidocaine +Omidira by protocol, Malyugin Ring Left Eye. Standard Lens only.

## 2024-07-24 ENCOUNTER — Encounter (HOSPITAL_COMMUNITY)
Admission: RE | Disposition: A | Discharge status: Home / Self Care | Payer: Self-pay | Source: Home / Self Care | Attending: Ophthalmology

## 2024-07-24 ENCOUNTER — Ambulatory Visit (HOSPITAL_COMMUNITY): Admitting: Anesthesiology

## 2024-07-24 ENCOUNTER — Ambulatory Visit (HOSPITAL_COMMUNITY)
Admission: RE | Admit: 2024-07-24 | Discharge: 2024-07-24 | Disposition: A | Discharge status: Home / Self Care | Attending: Ophthalmology | Admitting: Ophthalmology

## 2024-07-24 DIAGNOSIS — G4730 Sleep apnea, unspecified: Secondary | ICD-10-CM | POA: Diagnosis not present

## 2024-07-24 DIAGNOSIS — Z961 Presence of intraocular lens: Secondary | ICD-10-CM | POA: Diagnosis not present

## 2024-07-24 DIAGNOSIS — Z87891 Personal history of nicotine dependence: Secondary | ICD-10-CM | POA: Insufficient documentation

## 2024-07-24 DIAGNOSIS — H25812 Combined forms of age-related cataract, left eye: Secondary | ICD-10-CM | POA: Diagnosis present

## 2024-07-24 DIAGNOSIS — E1136 Type 2 diabetes mellitus with diabetic cataract: Secondary | ICD-10-CM | POA: Insufficient documentation

## 2024-07-24 DIAGNOSIS — F32A Depression, unspecified: Secondary | ICD-10-CM | POA: Diagnosis not present

## 2024-07-24 DIAGNOSIS — I10 Essential (primary) hypertension: Secondary | ICD-10-CM | POA: Insufficient documentation

## 2024-07-24 DIAGNOSIS — H5712 Ocular pain, left eye: Secondary | ICD-10-CM | POA: Insufficient documentation

## 2024-07-24 DIAGNOSIS — Z9841 Cataract extraction status, right eye: Secondary | ICD-10-CM | POA: Insufficient documentation

## 2024-07-24 LAB — GLUCOSE, CAPILLARY: Glucose-Capillary: 162 mg/dL — ABNORMAL HIGH (ref 70–99)

## 2024-07-24 MED ORDER — TETRACAINE HCL 0.5 % OP SOLN
1.0000 [drp] | OPHTHALMIC | Status: AC | PRN
  Administered 2024-07-24 (×3): 1 [drp] via OPHTHALMIC

## 2024-07-24 MED ORDER — SODIUM HYALURONATE 23MG/ML IO SOSY
PREFILLED_SYRINGE | INTRAOCULAR | Status: DC | PRN
  Administered 2024-07-24: .6 mL via INTRAOCULAR

## 2024-07-24 MED ORDER — STERILE WATER FOR IRRIGATION IR SOLN
Status: DC | PRN
  Administered 2024-07-24: 1

## 2024-07-24 MED ORDER — BSS IO SOLN
INTRAOCULAR | Status: DC | PRN
  Administered 2024-07-24: 15 mL via INTRAOCULAR

## 2024-07-24 MED ORDER — SODIUM CHLORIDE 0.9% FLUSH
INTRAVENOUS | Status: DC | PRN
  Administered 2024-07-24: 3 mL via INTRAVENOUS

## 2024-07-24 MED ORDER — MOXIFLOXACIN HCL 5 MG/ML IO SOLN
INTRAOCULAR | Status: DC | PRN
  Administered 2024-07-24: .3 mL via INTRACAMERAL

## 2024-07-24 MED ORDER — POVIDONE-IODINE 5 % OP SOLN
OPHTHALMIC | Status: DC | PRN
  Administered 2024-07-24: 1 via OPHTHALMIC

## 2024-07-24 MED ORDER — DEXAMETHASONE 0.4 MG OP INST
VAGINAL_INSERT | OPHTHALMIC | Status: AC
  Filled 2024-07-24: qty 1

## 2024-07-24 MED ORDER — TROPICAMIDE 1 % OP SOLN
1.0000 [drp] | OPHTHALMIC | Status: AC | PRN
  Administered 2024-07-24 (×3): 1 [drp] via OPHTHALMIC

## 2024-07-24 MED ORDER — LIDOCAINE HCL (PF) 1 % IJ SOLN
INTRAMUSCULAR | Status: DC | PRN
  Administered 2024-07-24: 1 mL

## 2024-07-24 MED ORDER — LIDOCAINE HCL 3.5 % OP GEL: 1.0000 | Freq: Once | OPHTHALMIC | Status: DC

## 2024-07-24 MED ORDER — MIDAZOLAM HCL 2 MG/2ML IJ SOLN
INTRAMUSCULAR | Status: AC
  Filled 2024-07-24: qty 2

## 2024-07-24 MED ORDER — SODIUM HYALURONATE 10 MG/ML IO SOLUTION
PREFILLED_SYRINGE | INTRAOCULAR | Status: DC | PRN
  Administered 2024-07-24: .85 mL via INTRAOCULAR

## 2024-07-24 MED ORDER — PHENYLEPHRINE HCL 2.5 % OP SOLN
1.0000 [drp] | OPHTHALMIC | Status: AC | PRN
  Administered 2024-07-24 (×3): 1 [drp] via OPHTHALMIC

## 2024-07-24 MED ORDER — PHENYLEPHRINE-KETOROLAC 1-0.3 % IO SOLN
INTRAOCULAR | Status: DC | PRN
  Administered 2024-07-24: 500 mL via OPHTHALMIC

## 2024-07-24 MED ORDER — MIDAZOLAM HCL (PF) 2 MG/2ML IJ SOLN
INTRAMUSCULAR | Status: DC | PRN
  Administered 2024-07-24: 1 mg via INTRAVENOUS

## 2024-07-24 MED ORDER — DEXAMETHASONE 0.4 MG OP INST
VAGINAL_INSERT | OPHTHALMIC | Status: DC | PRN
  Administered 2024-07-24: .4 mg via OPHTHALMIC

## 2024-07-24 NOTE — Discharge Instructions (Signed)
 Please discharge patient when stable, will follow up today with Dr. June Leap at the [facility] office immediately following discharge.  Leave shield in place until visit.  All paperwork with discharge instructions will be given at the office.  [facility] Address:  [address]  [place], [place] [address]

## 2024-07-24 NOTE — Anesthesia Preprocedure Evaluation (Signed)
"                                    Anesthesia Evaluation  Patient identified by MRN, date of birth, ID band Patient awake    Reviewed: Allergy & Precautions, H&P , NPO status , Patient's Chart, lab work & pertinent test results, reviewed documented beta blocker date and time   Airway Mallampati: II  TM Distance: >3 FB Neck ROM: full    Dental no notable dental hx.    Pulmonary sleep apnea , former smoker   Pulmonary exam normal breath sounds clear to auscultation       Cardiovascular Exercise Tolerance: Good hypertension,  Rhythm:regular Rate:Normal     Neuro/Psych  PSYCHIATRIC DISORDERS  Depression     Neuromuscular disease    GI/Hepatic negative GI ROS, Neg liver ROS,,,  Endo/Other  diabetes    Renal/GU negative Renal ROS  negative genitourinary   Musculoskeletal   Abdominal   Peds  Hematology negative hematology ROS (+)   Anesthesia Other Findings   Reproductive/Obstetrics negative OB ROS                              Anesthesia Physical Anesthesia Plan  ASA: 2  Anesthesia Plan: MAC   Post-op Pain Management:    Induction:   PONV Risk Score and Plan:   Airway Management Planned:   Additional Equipment:   Intra-op Plan:   Post-operative Plan:   Informed Consent: I have reviewed the patients History and Physical, chart, labs and discussed the procedure including the risks, benefits and alternatives for the proposed anesthesia with the patient or authorized representative who has indicated his/her understanding and acceptance.     Dental Advisory Given  Plan Discussed with: CRNA  Anesthesia Plan Comments:         Anesthesia Quick Evaluation  "

## 2024-07-24 NOTE — Op Note (Signed)
 Date of procedure: 07/24/24  Pre-operative diagnosis: Visually significant age-related combined cataract, Left Eye (H25.812)  Post-operative diagnosis:  Visually significant age-related combined cataract, Left Eye (H25.812) 2.   Pain and inflammation following cataract surgery, Left Eye (H57.12)  Procedure:  Removal of cataract via phacoemulsification and insertion of intra-ocular lens Johnson and Johnson DIB00 +22.0D into the capsular bag of the Left Eye 2. Placement of Dextenza  Implant, Left Lower Lid  Attending surgeon: Lynwood LABOR. Larua Collier, MD, MA  Anesthesia: MAC, Topical Akten  Complications: None  Estimated Blood Loss: <8mL (minimal)  Specimens: None  Implants: As above  Indications:  Visually significant age-related cataract, Left Eye  Procedure:  The patient was seen and identified in the pre-operative area. The operative eye was identified and dilated.  The operative eye was marked.  Topical anesthesia was administered to the operative eye.     The patient was then to the operative suite and placed in the supine position.  A timeout was performed confirming the patient, procedure to be performed, and all other relevant information.   The patient's face was prepped and draped in the usual fashion for intra-ocular surgery.  A lid speculum was placed into the operative eye and the surgical microscope moved into place and focused.  An inferotemporal paracentesis was created using a 20 gauge paracentesis blade. Omidria  was injected into the anterior chamber. Shugarcaine was injected into the anterior chamber.  Viscoelastic was injected into the anterior chamber.  A temporal clear-corneal main wound incision was created using a 2.79mm microkeratome.  A continuous curvilinear capsulorrhexis was initiated using an irrigating cystitome and completed using capsulorrhexis forceps.  Hydrodissection and hydrodeliniation were performed.  Viscoelastic was injected into the anterior chamber.  A  phacoemulsification handpiece and a chopper as a second instrument were used to remove the nucleus and epinucleus. The irrigation/aspiration handpiece was used to remove any remaining cortical material.   The capsular bag was reinflated with viscoelastic, checked, and found to be intact.  The intraocular lens was inserted into the capsular bag.  The irrigation/aspiration handpiece was used to remove any remaining viscoelastic.  The clear corneal wound and paracentesis wounds were then hydrated and checked with Weck-Cels to be watertight.  0.1mL of moxifloxacin  was injected into the anterior chamber.  The lid-speculum was removed. The lower punctum was dilated. A Dextenza  implant was placed in the lower canaliculus without complication.   The drape was removed.  The patient's face was cleaned with a wet and dry 4x4.   A clear shield was taped over the eye. The patient was taken to the post-operative care unit in good condition, having tolerated the procedure well.  Post-Op Instructions: The patient will follow up at [facility] for a same day post-operative evaluation and will receive all other orders and instructions.

## 2024-07-24 NOTE — Transfer of Care (Signed)
 Immediate Anesthesia Transfer of Care Note  Patient: [name]  Procedure(s) Performed: PHACOEMULSIFICATION, CATARACT, WITH IOL INSERTION (Left: Eye) INSERTION, STENT, DRUG-ELUTING, LACRIMAL CANALICULUS (Left: Eye)  Patient Location: Short Stay  Anesthesia Type:MAC  Level of Consciousness: awake, alert , oriented, and patient cooperative  Airway & Oxygen Therapy: Patient Spontanous Breathing  Post-op Assessment: Report given to RN and Post -op Vital signs reviewed and stable  Post vital signs: Reviewed and stable  Last Vitals:  Vitals Value Taken Time  BP 120/71 07/24/24 10:12  Temp    Pulse 72 07/24/24 10:12  Resp 12 07/24/24 10:12  SpO2 98% on RA 07/24/24 10;12    Last Pain:  Vitals:   07/24/24 0906  TempSrc: Oral  PainSc: 0-No pain      Patients Stated Pain Goal: 6 (07/24/24 0906)  Complications: No notable events documented.

## 2024-07-24 NOTE — Anesthesia Postprocedure Evaluation (Signed)
"   Anesthesia Post Note  Patient: [name]  Procedure(s) Performed: PHACOEMULSIFICATION, CATARACT, WITH IOL INSERTION (Left: Eye) INSERTION, STENT, DRUG-ELUTING, LACRIMAL CANALICULUS (Left: Eye)  Patient location during evaluation: Phase II Anesthesia Type: MAC Level of consciousness: awake Pain management: pain level controlled Vital Signs Assessment: post-procedure vital signs reviewed and stable Respiratory status: spontaneous breathing and respiratory function stable Cardiovascular status: blood pressure returned to baseline and stable Postop Assessment: no headache and no apparent nausea or vomiting Anesthetic complications: no Comments: Late entry   No notable events documented.   Last Vitals:  Vitals:   07/24/24 0906 07/24/24 1012  BP: 119/74 120/71  Pulse: 76 66  Resp: 18 13  Temp: 36.7 C 36.8 C  SpO2: 100% 98%    Last Pain:  Vitals:   07/24/24 1012  TempSrc: Oral  PainSc: 0-No pain                 [name] Deyonte Cadden      "

## 2024-07-26 ENCOUNTER — Encounter (HOSPITAL_COMMUNITY): Payer: Self-pay | Admitting: Ophthalmology

## 2024-08-05 ENCOUNTER — Encounter: Admitting: Nutrition

## 2024-09-21 ENCOUNTER — Ambulatory Visit: Payer: Self-pay
